# Patient Record
Sex: Female | Born: 1990 | Race: White | Hispanic: No | Marital: Single | Smoking: Never smoker
Health system: Southern US, Community
[De-identification: ages and names within clinical notes are randomized; demographics above are authoritative.]

## PROBLEM LIST (undated history)

## (undated) DIAGNOSIS — N83201 Unspecified ovarian cyst, right side: Secondary | ICD-10-CM

## (undated) HISTORY — PX: INDUCED ABORTION: SHX677

## (undated) HISTORY — PX: WISDOM TOOTH EXTRACTION: SHX21

---

## 2016-10-10 ENCOUNTER — Inpatient Hospital Stay (HOSPITAL_COMMUNITY): Admit: 2016-10-10 | Payer: Self-pay

## 2016-10-10 ENCOUNTER — Other Ambulatory Visit: Payer: Self-pay | Admitting: Obstetrics & Gynecology

## 2016-10-10 ENCOUNTER — Other Ambulatory Visit (HOSPITAL_COMMUNITY)
Admission: RE | Admit: 2016-10-10 | Discharge: 2016-10-10 | Disposition: A | Discharge status: Home / Self Care | Payer: Federal, State, Local not specified - PPO | Source: Ambulatory Visit | Attending: Obstetrics & Gynecology | Admitting: Obstetrics & Gynecology

## 2016-10-10 DIAGNOSIS — Z113 Encounter for screening for infections with a predominantly sexual mode of transmission: Secondary | ICD-10-CM | POA: Insufficient documentation

## 2016-10-10 DIAGNOSIS — Z01419 Encounter for gynecological examination (general) (routine) without abnormal findings: Secondary | ICD-10-CM | POA: Insufficient documentation

## 2016-10-16 LAB — CYTOLOGY - PAP
Chlamydia: NEGATIVE
DIAGNOSIS: NEGATIVE
Neisseria Gonorrhea: NEGATIVE

## 2017-04-07 ENCOUNTER — Emergency Department (HOSPITAL_COMMUNITY): Payer: Federal, State, Local not specified - PPO

## 2017-04-07 ENCOUNTER — Encounter (HOSPITAL_COMMUNITY): Payer: Self-pay | Admitting: Emergency Medicine

## 2017-04-07 ENCOUNTER — Emergency Department (HOSPITAL_COMMUNITY)
Admission: EM | Admit: 2017-04-07 | Discharge: 2017-04-07 | Disposition: A | Discharge status: Home / Self Care | Payer: Federal, State, Local not specified - PPO | Attending: Emergency Medicine | Admitting: Emergency Medicine

## 2017-04-07 DIAGNOSIS — R109 Unspecified abdominal pain: Secondary | ICD-10-CM | POA: Diagnosis present

## 2017-04-07 DIAGNOSIS — R52 Pain, unspecified: Secondary | ICD-10-CM

## 2017-04-07 DIAGNOSIS — D369 Benign neoplasm, unspecified site: Secondary | ICD-10-CM | POA: Diagnosis not present

## 2017-04-07 LAB — URINALYSIS, ROUTINE W REFLEX MICROSCOPIC
BILIRUBIN URINE: NEGATIVE
GLUCOSE, UA: NEGATIVE mg/dL
Hgb urine dipstick: NEGATIVE
KETONES UR: 20 mg/dL — AB
Leukocytes, UA: NEGATIVE
Nitrite: NEGATIVE
PH: 5 (ref 5.0–8.0)
Protein, ur: NEGATIVE mg/dL
Specific Gravity, Urine: 1.023 (ref 1.005–1.030)

## 2017-04-07 LAB — CBC
HEMATOCRIT: 40.6 % (ref 36.0–46.0)
Hemoglobin: 14 g/dL (ref 12.0–15.0)
MCH: 30.4 pg (ref 26.0–34.0)
MCHC: 34.5 g/dL (ref 30.0–36.0)
MCV: 88.3 fL (ref 78.0–100.0)
Platelets: 349 10*3/uL (ref 150–400)
RBC: 4.6 MIL/uL (ref 3.87–5.11)
RDW: 12.5 % (ref 11.5–15.5)
WBC: 12.2 10*3/uL — ABNORMAL HIGH (ref 4.0–10.5)

## 2017-04-07 LAB — LIPASE, BLOOD: Lipase: 17 U/L (ref 11–51)

## 2017-04-07 LAB — COMPREHENSIVE METABOLIC PANEL
ALBUMIN: 4.6 g/dL (ref 3.5–5.0)
ALT: 14 U/L (ref 14–54)
AST: 22 U/L (ref 15–41)
Alkaline Phosphatase: 35 U/L — ABNORMAL LOW (ref 38–126)
Anion gap: 10 (ref 5–15)
BILIRUBIN TOTAL: 1.9 mg/dL — AB (ref 0.3–1.2)
BUN: 13 mg/dL (ref 6–20)
CHLORIDE: 106 mmol/L (ref 101–111)
CO2: 24 mmol/L (ref 22–32)
Calcium: 9.6 mg/dL (ref 8.9–10.3)
Creatinine, Ser: 0.9 mg/dL (ref 0.44–1.00)
GFR calc Af Amer: >60 mL/min (ref >60)
GFR calc non Af Amer: >60 mL/min (ref >60)
GLUCOSE: 122 mg/dL — AB (ref 65–99)
POTASSIUM: 4.6 mmol/L (ref 3.5–5.1)
Sodium: 140 mmol/L (ref 135–145)
TOTAL PROTEIN: 8.2 g/dL — AB (ref 6.5–8.1)

## 2017-04-07 LAB — I-STAT BETA HCG BLOOD, ED (MC, WL, AP ONLY): I-stat hCG, quantitative: <5 m[IU]/mL (ref <5)

## 2017-04-07 MED ORDER — SODIUM CHLORIDE 0.9 % IV BOLUS (SEPSIS)
1000.0000 mL | Freq: Once | INTRAVENOUS | Status: AC
  Administered 2017-04-07: 1000 mL via INTRAVENOUS

## 2017-04-07 MED ORDER — ONDANSETRON HCL 4 MG/2ML IJ SOLN: 4.0000 mg | Freq: Once | INTRAMUSCULAR | Status: DC

## 2017-04-07 MED ORDER — FENTANYL CITRATE (PF) 100 MCG/2ML IJ SOLN
100.0000 ug | Freq: Once | INTRAMUSCULAR | Status: AC
  Administered 2017-04-07: 100 ug via INTRAVENOUS
  Filled 2017-04-07: qty 2

## 2017-04-07 MED ORDER — IOPAMIDOL (ISOVUE-300) INJECTION 61%
INTRAVENOUS | Status: AC
  Filled 2017-04-07: qty 100

## 2017-04-07 MED ORDER — OXYCODONE-ACETAMINOPHEN 5-325 MG PO TABS
2.0000 | ORAL_TABLET | Freq: Once | ORAL | Status: AC
  Administered 2017-04-07: 2 via ORAL
  Filled 2017-04-07: qty 2

## 2017-04-07 MED ORDER — ONDANSETRON 4 MG PO TBDP: 4.0000 mg | ORAL_TABLET | Freq: Three times a day (TID) | ORAL | 0 refills | Status: DC | PRN

## 2017-04-07 MED ORDER — ONDANSETRON 4 MG PO TBDP
4.0000 mg | ORAL_TABLET | Freq: Once | ORAL | Status: AC | PRN
  Administered 2017-04-07: 4 mg via ORAL
  Filled 2017-04-07: qty 1

## 2017-04-07 MED ORDER — IOPAMIDOL (ISOVUE-300) INJECTION 61%
30.0000 mL | Freq: Once | INTRAVENOUS | Status: AC | PRN
  Administered 2017-04-07: 30 mL via ORAL

## 2017-04-07 MED ORDER — OXYCODONE-ACETAMINOPHEN 5-325 MG PO TABS: 2.0000 | ORAL_TABLET | ORAL | 0 refills | Status: DC | PRN

## 2017-04-07 MED ORDER — PROMETHAZINE HCL 25 MG/ML IJ SOLN
12.5000 mg | Freq: Once | INTRAMUSCULAR | Status: AC
  Administered 2017-04-07: 12.5 mg via INTRAVENOUS
  Filled 2017-04-07: qty 1

## 2017-04-07 MED ORDER — IOPAMIDOL (ISOVUE-300) INJECTION 61%
INTRAVENOUS | Status: AC
  Filled 2017-04-07: qty 30

## 2017-04-07 MED ORDER — ONDANSETRON HCL 4 MG/2ML IJ SOLN
4.0000 mg | Freq: Once | INTRAMUSCULAR | Status: AC
  Administered 2017-04-07: 4 mg via INTRAVENOUS
  Filled 2017-04-07: qty 2

## 2017-04-07 MED ORDER — ONDANSETRON 4 MG PO TBDP
4.0000 mg | ORAL_TABLET | Freq: Once | ORAL | Status: AC
  Administered 2017-04-07: 4 mg via ORAL
  Filled 2017-04-07: qty 1

## 2017-04-07 MED ORDER — NAPROXEN 500 MG PO TABS: 500.0000 mg | ORAL_TABLET | Freq: Two times a day (BID) | ORAL | 0 refills | Status: DC

## 2017-04-07 MED ORDER — KETOROLAC TROMETHAMINE 30 MG/ML IJ SOLN
30.0000 mg | Freq: Once | INTRAMUSCULAR | Status: AC
  Administered 2017-04-07: 30 mg via INTRAVENOUS
  Filled 2017-04-07: qty 1

## 2017-04-07 MED ORDER — IOPAMIDOL (ISOVUE-300) INJECTION 61%
100.0000 mL | Freq: Once | INTRAVENOUS | Status: AC | PRN
  Administered 2017-04-07: 80 mL via INTRAVENOUS

## 2017-04-07 NOTE — ED Notes (Signed)
Pt threw up contrast.  MD and CT aware.  Will start IV.

## 2017-04-07 NOTE — ED Provider Notes (Signed)
Pt updated on results of [place]. No torsion. Complex, possible Dermoid. She is still having intermittent episodes of pain. Will give Toradol, Percocet, zofran. She had GYN at  [facility]. Asked to contact for FU asap to discuss ongoing care and treatment.   Rx: Percocet, zofran, and Naproxen.   Tanna Furry, MD 04/07/17 [phone]

## 2017-04-07 NOTE — ED Notes (Signed)
Attempted IV placement with no success-informed MD-OK to give PO fluids

## 2017-04-07 NOTE — ED Triage Notes (Signed)
Patient c/o mid to lower abd pain that started yesterday with vomiting and little diarrhea.  Patient unsure if food poisoning or not but googled that if having severe adb pain or yellow vomit to go to ED.

## 2017-04-07 NOTE — ED Notes (Signed)
Patient stated that shes able to take small sip and keep them down. Patient still doesn't have to use restroom and will call out when ready.

## 2017-04-07 NOTE — ED Provider Notes (Signed)
[facility] DEPT Provider Note   CSN: [id number] Arrival date & time: 04/07/17  0702     History   Chief Complaint Chief Complaint  Patient presents with  . Abdominal Pain  . Emesis    HPI Rhonda Bradshaw is a 26 y.o. female.  She began feeling nauseated and started vomiting yesterday.  She has been unable to tolerate liquids, and has decreased urinary output secondary to that.  She last vomited this morning.  The emesis has been yellow in color.  There is been no blood in the emesis.  She has had a few episodes of loose stools.  She has no known sick contacts.  She is concerned that she may have gotten food poisoning because she ate some eggs salad that she made, which was a few days old.  She has had some chills.  She works as a Patent attorney.  There are no other known modifying factors.  HPI  History reviewed. No pertinent past medical history.  There are no active problems to display for this patient.   History reviewed. No pertinent surgical history.  OB History    No data available       Home Medications    Prior to Admission medications   Not on File    Family History No family history on file.  Social History Social History  Substance Use Topics  . Smoking status: Never Smoker  . Smokeless tobacco: Never Used  . Alcohol use Yes     Comment: occasioanl      Allergies   Patient has no known allergies.   Review of Systems Review of Systems  All other systems reviewed and are negative.    Physical Exam Updated Vital Signs BP 134/87   Pulse 68   Temp 98 F (36.7 C)   Resp 15   Ht 5\' 4"  (1.626 m)   Wt 54.5 kg (120 lb 1 oz)   LMP 03/24/2017   SpO2 95%   BMI 20.61 kg/m   Physical Exam  Constitutional: She is oriented to person, place, and time. She appears well-developed and well-nourished.  HENT:  Head: Normocephalic and atraumatic.  Eyes: Conjunctivae and EOM are normal. Pupils are equal, round, and reactive to light.    Neck: Normal range of motion and phonation normal. Neck supple.  Cardiovascular: Normal rate and regular rhythm.   Pulmonary/Chest: Effort normal and breath sounds normal. She exhibits no tenderness.  Abdominal: Soft. She exhibits no distension and no mass. There is tenderness (Diffuse, mild). There is no rebound and no guarding. No hernia.  Musculoskeletal: Normal range of motion.  Neurological: She is alert and oriented to person, place, and time. She exhibits normal muscle tone.  Skin: Skin is warm and dry. There is erythema (Anterior torso, consistent with sunburn).  Psychiatric: She has a normal mood and affect. Her behavior is normal. Judgment and thought content normal.  Nursing note and vitals reviewed.    ED Treatments / Results  Labs (all labs ordered are listed, but only abnormal results are displayed) Labs Reviewed  COMPREHENSIVE METABOLIC PANEL - Abnormal; Notable for the following:       Result Value   Glucose, Bld 122 (*)    Total Protein 8.2 (*)    Alkaline Phosphatase 35 (*)    Total Bilirubin 1.9 (*)    All other components within normal limits  CBC - Abnormal; Notable for the following:    WBC 12.2 (*)    All other components within  normal limits  LIPASE, BLOOD  URINALYSIS, ROUTINE W REFLEX MICROSCOPIC  I-STAT BETA HCG BLOOD, ED ([facility], [facility], [facility] ONLY)    EKG  EKG Interpretation None       Radiology No results found.  Procedures Procedures (including critical care time)  Medications Ordered in ED Medications  sodium chloride 0.9 % bolus 1,000 mL (not administered)  ondansetron (ZOFRAN) injection 4 mg (not administered)  ondansetron (ZOFRAN-ODT) disintegrating tablet 4 mg (4 mg Oral Given 04/07/17 4818)     Initial Impression / Assessment and Plan / ED Course  I have reviewed the triage vital signs and the nursing notes.  Pertinent labs & imaging results that were available during my care of the patient were reviewed by me and considered in my  medical decision making (see chart for details).  Clinical Course as of Apr 07 1737  Mon Apr 07, 2017  1515 The patient vomited the oral contrast that she had been drinking.  Additional antiemetic medication ordered  [EW]    Clinical Course User Index [EW] Daleen Bo, MD     Patient Vitals for the past 24 hrs:  BP Temp Pulse Resp SpO2 Height Weight  04/07/17 0719 134/87 98 F (36.7 C) 68 15 95 % - -  04/07/17 0717 - - - - - 5\' 4"  (1.626 m) 54.5 kg (120 lb 1 oz)    5:18 PM Reevaluation with update and discussion. After initial assessment and treatment, an updated evaluation reveals patient is fairly comfortable now but still having periods of intermittent right lower quadrant abdominal pain.  Patient updated on findings. Dondi Burandt L   17: 30-case discussed with gynecologist, Dr. Roselie Awkward.  He recommends proceeding with ultrasound, with Doppler, to rule out torsion.  Patient updated and agrees to proceed in this fashion.  Final Clinical Impressions(s) / ED Diagnoses   Final diagnoses:  Dermoid tumor     Vomiting and abdominal pain.  No apparent appendicitis or intestinal disorder.  Dermoid tumor right ovary is present, note patient is having normal periods, last was 2 weeks ago.  Additional evaluation with ultrasound imaging to assess for torsion, pending at the time of transfer of care.  Nursing Notes Reviewed/ Care Coordinated Applicable Imaging Reviewed Interpretation of Laboratory Data incorporated into ED treatment   17: 40-transferred cared, to Dr. Jeneen Rinks, to evaluate patient after ultrasound imaging.  New Prescriptions New Prescriptions   No medications on file     Daleen Bo, MD 04/07/17 1740

## 2017-04-07 NOTE — Discharge Instructions (Signed)
Naproxen and or Percocet for pain. Call your GYN tomorrow to make appointment as soon as possible to discuss definitive care, and possible surgical procedure.

## 2017-04-09 ENCOUNTER — Inpatient Hospital Stay (HOSPITAL_COMMUNITY)
Admission: AD | Admit: 2017-04-09 | Discharge: 2017-04-09 | Disposition: A | Discharge status: Home / Self Care | Payer: Federal, State, Local not specified - PPO | Source: Ambulatory Visit | Attending: Obstetrics and Gynecology | Admitting: Obstetrics and Gynecology

## 2017-04-09 ENCOUNTER — Inpatient Hospital Stay (HOSPITAL_COMMUNITY): Payer: Federal, State, Local not specified - PPO

## 2017-04-09 ENCOUNTER — Encounter (HOSPITAL_COMMUNITY): Payer: Self-pay | Admitting: *Deleted

## 2017-04-09 DIAGNOSIS — R112 Nausea with vomiting, unspecified: Secondary | ICD-10-CM | POA: Insufficient documentation

## 2017-04-09 DIAGNOSIS — R102 Pelvic and perineal pain: Secondary | ICD-10-CM | POA: Insufficient documentation

## 2017-04-09 DIAGNOSIS — N83201 Unspecified ovarian cyst, right side: Secondary | ICD-10-CM | POA: Diagnosis present

## 2017-04-09 DIAGNOSIS — R109 Unspecified abdominal pain: Secondary | ICD-10-CM | POA: Diagnosis present

## 2017-04-09 DIAGNOSIS — N83209 Unspecified ovarian cyst, unspecified side: Secondary | ICD-10-CM

## 2017-04-09 HISTORY — DX: Unspecified ovarian cyst, right side: N83.201

## 2017-04-09 MED ORDER — LACTATED RINGERS IV SOLN
INTRAVENOUS | Status: DC
  Administered 2017-04-09: 04:00:00 via INTRAVENOUS

## 2017-04-09 MED ORDER — HYDROMORPHONE HCL 1 MG/ML IJ SOLN
1.0000 mg | Freq: Once | INTRAMUSCULAR | Status: AC
  Administered 2017-04-09: 1 mg via INTRAVENOUS

## 2017-04-09 MED ORDER — HYDROMORPHONE HCL 1 MG/ML IJ SOLN
1.0000 mg | Freq: Once | INTRAMUSCULAR | Status: DC
  Filled 2017-04-09: qty 1

## 2017-04-09 MED ORDER — PROMETHAZINE HCL 25 MG/ML IJ SOLN
12.5000 mg | Freq: Once | INTRAMUSCULAR | Status: AC
  Administered 2017-04-09: 12.5 mg via INTRAVENOUS
  Filled 2017-04-09: qty 1

## 2017-04-09 MED ORDER — HYDROMORPHONE HCL 2 MG PO TABS: 2.0000 mg | ORAL_TABLET | Freq: Four times a day (QID) | ORAL | 0 refills | Status: DC | PRN

## 2017-04-09 NOTE — MAU Note (Signed)
History     26 y/o G1P0010 LMP 03/24/17 with known history of right ovarian cyst presenting with right sided lower abdominal pain that was un resolving with percocet and naproxen use.  Pt. also had nausea symptoms but no recent vomiting.    Patient Active Problem List   Diagnosis Date Noted  . Ovarian cyst, right 04/09/2017    Chief Complaint  Patient presents with  . Abdominal Pain   HPI  OB History    Gravida Para Term Preterm AB Living   1       1     SAB TAB Ectopic Multiple Live Births     1            Past Medical History:  Diagnosis Date  . Ovarian cyst, right 04/09/2017    History reviewed. No pertinent surgical history.  History reviewed. No pertinent family history.  Social History  Substance Use Topics  . Smoking status: Never Smoker  . Smokeless tobacco: Never Used  . Alcohol use Yes     Comment: occasioanl     Allergies: No Known Allergies  Prescriptions Prior to Admission  Medication Sig Dispense Refill Last Dose  . ibuprofen (ADVIL,MOTRIN) 200 MG tablet Take 400 mg by mouth every 6 (six) hours as needed for mild pain or moderate pain.   04/07/2017 at Unknown time  . JUNEL FE 1/20 1-20 MG-MCG tablet Take 1 tablet by mouth daily.  3 04/07/2017 at Unknown time  . naproxen (NAPROSYN) 500 MG tablet Take 1 tablet (500 mg total) by mouth 2 (two) times daily. 30 tablet 0   . ondansetron (ZOFRAN ODT) 4 MG disintegrating tablet Take 1 tablet (4 mg total) by mouth every 8 (eight) hours as needed for nausea. 6 tablet 0   . oxyCODONE-acetaminophen (PERCOCET/ROXICET) 5-325 MG tablet Take 2 tablets by mouth every 4 (four) hours as needed. 12 tablet 0     ROS  Constitutional: Denies fevers/chills Cardiovascular: Denies chest pain or palpitations Pulmonary: Denies coughing or wheezing Gastrointestinal: With nausea, denies vomiting or diarrhea.  With constipation. Genitourinary: With pelvic pain, unusual vaginal bleeding, unusual vaginal discharge, dysuria, urgency or  frequency.  Musculoskeletal: Denies muscle or joint aches and pain.  Neurology: Denies abnormal sensations such as tingling or numbness.   Physical Exam   Blood pressure 107/69, pulse (!) 58, temperature 98.3 F (36.8 C), temperature source Oral, resp. rate 18, height 5\' 4"  (1.626 m), weight 55.2 kg (121 lb 12 oz), last menstrual period 03/24/2017. Gen: Sleepy, no distress.  Mother and uncle at bedside.  CVS: s1, s2, RRR Pulm: CTAB Abdomen: Soft, positive bowel sounds, tender to palpation in lower abdomen bilaterally, right greater than left side.  No rebound, no guarding.  Extremities: Warm and well perfused, no edema, no calf tenderness bilaterally.   IMAGING:   Pelvic Ultrasound 04/09/2017:    FINDINGS: Uterus  Measurements: 7.4 x 4.3 x 5.7 cm. No fibroids or other mass visualized.  Endometrium  Thickness: 0.3 cm. No focal abnormality visualized.  Right ovary  Measurements: 10.0 x 8.6 x 7.3 cm. A large simple appearing 8.9 x 8.5 x 7.3 cm cyst is noted at the right ovary.  Left ovary  Not visualized.  Pulsed Doppler evaluation demonstrates normal low-resistance arterial and venous waveforms at the right ovary.  IMPRESSION: No evidence for ovarian torsion on the right. Large 9 cm right ovarian cyst again noted. The left ovary is not visualized.   CT ABDOMEN AND PELVIS WITH CONTRAST: 04/07/2017  TECHNIQUE: Multidetector CT imaging of the abdomen and pelvis was performed using the standard protocol following bolus administration of intravenous contrast.  CONTRAST:  75mL ISOVUE-300 IOPAMIDOL (ISOVUE-300) INJECTION 61%  COMPARISON:  None.  FINDINGS: Lower chest: No acute abnormality.  Hepatobiliary: No focal liver abnormality is seen. No gallstones, gallbladder wall thickening, or biliary dilatation.  Pancreas: Unremarkable. No pancreatic ductal dilatation or surrounding inflammatory changes.  Spleen: Normal in size without focal  abnormality.  Adrenals/Urinary Tract: Adrenal glands are unremarkable. Kidneys are normal, without renal calculi, focal lesion, or hydronephrosis. Bladder is unremarkable.  Stomach/Bowel: Stomach is within normal limits. Appendix appears normal. No evidence of bowel wall thickening, distention, or inflammatory changes.  Vascular/Lymphatic: No significant vascular findings are present. No enlarged abdominal or pelvic lymph nodes.  Reproductive: Right ovarian 11.4 cm dermoid tumor with a large 9.6 cm cystic component and a contiguous 5.1 cm more solid component with macroscopic fat and coarse calcifications. No adjacent inflammatory/edematous changes. Uterus and left adnexal region unremarkable.  Other: Trace free fluid in the pelvis, probably physiologic. No free air.  Musculoskeletal: Transitional lumbosacral segment. No fracture, dislocation, or worrisome bone lesion.  IMPRESSION: 1. No acute findings. 2. 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 Recommend gynecologic consultation.  Pelvic Ultrasound: 04/07/2017  FINDINGS: Uterus  Measurements: 7.8 x 3.5 x 5 cm. No fibroids or other mass visualized.  Endometrium  Thickness: 2.5 mm.  No focal abnormality visualized.  Right ovary  In the right adnexa, there is a large right ovarian cyst with internal septations measuring 11.6 x 4.9 x 9 cm.  Left ovary  Measurements: 2.7 x 1.4 x 3 cm. Normal appearance/no adnexal mass.  Pulsed Doppler evaluation of both ovaries demonstrates normal low-resistance arterial and venous waveforms.  Other findings  Small amount of free fluid is identified in the pelvis.  IMPRESSION: Large complicated cyst of the right ovary measuring 11.6 x 4.9 x 9 cm. There is no evidence a right ovary torsion as arterial and venous flow are identified in the periphery of the complicated cyst/right ovarian tissue.    Physical Exam: see above.   ED Course   Assessment:  Patient received dilaudid 1 mg IV x 1 and phenergan 25 mg IV x 1.  She felt better after pain medication use, rating her pain from, level 8/10 intensity to level 3 - 4/10.  She had no vomiting episodes in MAU.  Ultrasound results were discussed with the patient and her family that showed 9cm right ovarian, simple cyst and normal doppler flow studies to bilateral ovaries.  We discussed that prior ultrasound had shown complex ovarian cyst but that  it looks more like a simple cyst today.  We also discussed cyst looked smaller than before (11.6cm vs. 8.9cm today) and that simple cysts can resolve on own with time.  She was given options of expectant management versus surgical management via laparoscopy ovarian cystectomy, possible oophorectomy, possible laparotomy after discussing risks, benefits and alternatives of both approaches.       Plan:  - Patient desired expectant management for now.  Strict pain, bleeding, ovarian torsion and cyst rupture precautions were reviewed.   -She has an upcoming appointment with Dr. Nelda Marseille tomorrow at 7.45 am and she will follow up with her.   - All her questions were answered and she expressed understanding.  - She was discharged home with dilaudid oral tablets, also continue with naproxen tabs as prescribed before.  - She was advised to use miralax for constipation as she has  been without bowel movement for about 2 days.    Dr. Alesia Richards.  04/09/2017. 9311     Alinda Dooms MD.  04/09/2017 6:58 AM

## 2017-04-09 NOTE — Discharge Instructions (Signed)
Ovarian Cyst An ovarian cyst is a fluid-filled sac that forms on an ovary. The ovaries are small organs that produce eggs in women. Various types of cysts can form on the ovaries. Some may cause symptoms and require treatment. Most ovarian cysts go away on their own, are not cancerous (are benign), and do not cause problems. Common types of ovarian cysts include:  Functional (follicle) cysts. ? Occur during the menstrual cycle, and usually go away with the next menstrual cycle if you do not get pregnant. ? Usually cause no symptoms.  Endometriomas. ? Are cysts that form from the tissue that lines the uterus (endometrium). ? Are sometimes called chocolate cysts because they become filled with blood that turns brown. ? Can cause pain in the lower abdomen during intercourse and during your period.  Cystadenoma cysts. ? Develop from cells on the outside surface of the ovary. ? Can get very large and cause lower abdomen pain and pain with intercourse. ? Can cause severe pain if they twist or break open (rupture).  Dermoid cysts. ? Are sometimes found in both ovaries. ? May contain different kinds of body tissue, such as skin, teeth, hair, or cartilage. ? Usually do not cause symptoms unless they get very big.  Theca lutein cysts. ? Occur when too much of a certain hormone (human chorionic gonadotropin) is produced and overstimulates the ovaries to produce an egg. ? Are most common after having procedures used to assist with the conception of a baby (in vitro fertilization).  What are the causes? Ovarian cysts may be caused by:  Ovarian hyperstimulation syndrome. This is a condition that can develop from taking fertility medicines. It causes multiple large ovarian cysts to form.  Polycystic ovarian syndrome (PCOS). This is a common hormonal disorder that can cause ovarian cysts, as well as problems with your period or fertility.  What increases the risk? The following factors may make  you more likely to develop ovarian cysts:  Being overweight or obese.  Taking fertility medicines.  Taking certain forms of hormonal birth control.  Smoking.  What are the signs or symptoms? Many ovarian cysts do not cause symptoms. If symptoms are present, they may include:  Pelvic pain or pressure.  Pain in the lower abdomen.  Pain during sex.  Abdominal swelling.  Abnormal menstrual periods.  Increasing pain with menstrual periods.  How is this diagnosed? These cysts are commonly found during a routine pelvic exam. You may have tests to find out more about the cyst, such as:  Ultrasound.  X-ray of the pelvis.  CT scan.  MRI.  Blood tests.  How is this treated? Many ovarian cysts go away on their own without treatment. Your health care provider may want to check your cyst regularly for 2-3 months to see if it changes. If you are in menopause, it is especially important to have your cyst monitored closely because menopausal women have a higher rate of ovarian cancer. When treatment is needed, it may include:  Medicines to help relieve pain.  A procedure to drain the cyst (aspiration).  Surgery to remove the whole cyst.  Hormone treatment or birth control pills. These methods are sometimes used to help dissolve a cyst.  Follow these instructions at home:  Take over-the-counter and prescription medicines only as told by your health care provider.  Do not drive or use heavy machinery while taking prescription pain medicine.  Get regular pelvic exams and Pap tests as often as told by your health care  provider.  Return to your normal activities as told by your health care provider. Ask your health care provider what activities are safe for you.  Do not use any products that contain nicotine or tobacco, such as cigarettes and e-cigarettes. If you need help quitting, ask your health care provider.  Keep all follow-up visits as told by your health care provider.  This is important. Contact a health care provider if:  Your periods are late, irregular, or painful, or they stop.  You have pelvic pain that does not go away.  You have pressure on your bladder or trouble emptying your bladder completely.  You have pain during sex.  You have any of the following in your abdomen: ? A feeling of fullness. ? Pressure. ? Discomfort. ? Pain that does not go away. ? Swelling.  You feel generally ill.  You become constipated.  You lose your appetite.  You develop severe acne.  You start to have more body hair and facial hair.  You are gaining weight or losing weight without changing your exercise and eating habits.  You think you may be pregnant. Get help right away if:  You have abdominal pain that is severe or gets worse.  You cannot eat or drink without vomiting.  You suddenly develop a fever.  Your menstrual period is much heavier than usual. This information is not intended to replace advice given to you by your health care provider. Make sure you discuss any questions you have with your health care provider. Document Released: 09/23/2005 Document Revised: 04/12/2016 Document Reviewed: 02/25/2016 Elsevier Interactive Patient Education  2018 [place] [employer].  Pelvic Mass A pelvic mass is an abnormal growth in the pelvis. The pelvis is the area between your hip bones. It includes the bladder and the rectum in males and females, and also the uterus and ovaries in females. What are the causes? Many things can cause a pelvic mass, including:  Cancer.  Fibroids of the uterus.  Ovarian cysts.  Infection.  Ectopic pregnancy.  What are the signs or symptoms? Symptoms of a pelvic mass may include:  Cramping.  Nausea.  Diarrhea.  Fever.  Vomiting.  Weakness.  Pain in the pelvis, side, or back.  Weight loss.  Constipation.  Problems with vaginal bleeding, including: ? Light or heavy bleeding with or without blood  clots. ? Irregular menstruation. ? Pain with menstruation.  Problems with urination, including: ? Frequent urination. ? Inability to empty the bladder completely. ? Urinating very small amounts. ? Pain with urination. ? Bloody urine.  Some pelvic masses do not cause symptoms. How is this diagnosed? To make a diagnosis, your health care provider will need to learn more about the mass. You may have tests or procedures done, such as:  Blood tests.  X-rays.  Ultrasound.  CT scan.  MRI.  A surgery to look inside of your abdomen with cameras (laparoscopy).  A biopsy that is performed with a needle or during laparoscopy or surgery.  In some cases, what seemed like a pelvic mass may actually be something else, such as a mass in one of the organs that are near the pelvis, an infection (abscess) or scar tissue (adhesions) that formed after a surgery. How is this treated? Treatment will depend on the cause of the mass. Follow these instructions at home: What you need to do at home will depend on the cause of the mass. Follow the instructions that your health care provider gives to you. In general:  Keep  all follow-up visits as directed by your health care provider. This is important.  Take medicines only as directed by your health care provider.  Follow any restrictions that are given to you by your health care provider.  Contact a health care provider if:  You develop new symptoms. Get help right away if:  You vomit bright red blood or vomit material that looks like coffee grounds.  You have blood in your stools, or the stools turn black and tarry.  You have an abnormal or increased amount of vaginal bleeding.  You have a fever.  You develop easy bruising or bleeding.  You develop sudden or worsening pain that is not controlled by your medicine.  You feel worsening weakness, or you have a fainting episode.  You feel that the mass has suddenly gotten larger.  You  develop severe bloating in your abdomen or your pelvis.  You cannot pass any urine.  You are unable to have a bowel movement. This information is not intended to replace advice given to you by your health care provider. Make sure you discuss any questions you have with your health care provider. Document Released: 12/31/2006 Document Revised: 02/29/2016 Document Reviewed: 05/09/2014 Elsevier Interactive Patient Education  2018 [employer].

## 2017-04-09 NOTE — MAU Provider Note (Signed)
History     CSN: [id number]  Arrival date and time: 04/09/17 0121   First Provider Initiated Contact with Patient 04/09/17 [phone]      Chief Complaint  Patient presents with  . Abdominal Pain   HPI  Ms. Rhonda Bradshaw is a 26 yo G1P0010 non-pregnant female presenting with complaints of pain from ovarian cyst and N/V from the pain. She took 1 Percocet at 2230 and then another one at 16.  She's been taking Naproxen as well as Naproxen with no relief. She states that Dr. Landry Mellow reviewed her images and told her that "the cyst would moren than likely need to be surgically removed, but Dr. Nelda Marseille would need to make that decision when she returns on Thursday 7/5".  She states that she "cannot take it another 24 hrs".  She is requesting that her surgery be done today.  She states she has been taking pain med and anti-nausea med off and on without any relief.  She rates her pain a 8-9/10.  History reviewed. No pertinent past medical history.  History reviewed. No pertinent surgical history.  History reviewed. No pertinent family history.  Social History  Substance Use Topics  . Smoking status: Never Smoker  . Smokeless tobacco: Never Used  . Alcohol use Yes     Comment: occasioanl     Allergies: No Known Allergies  Prescriptions Prior to Admission  Medication Sig Dispense Refill Last Dose  . ibuprofen (ADVIL,MOTRIN) 200 MG tablet Take 400 mg by mouth every 6 (six) hours as needed for mild pain or moderate pain.   04/07/2017 at Unknown time  . JUNEL FE 1/20 1-20 MG-MCG tablet Take 1 tablet by mouth daily.  3 04/07/2017 at Unknown time  . naproxen (NAPROSYN) 500 MG tablet Take 1 tablet (500 mg total) by mouth 2 (two) times daily. 30 tablet 0   . ondansetron (ZOFRAN ODT) 4 MG disintegrating tablet Take 1 tablet (4 mg total) by mouth every 8 (eight) hours as needed for nausea. 6 tablet 0   . oxyCODONE-acetaminophen (PERCOCET/ROXICET) 5-325 MG tablet Take 2 tablets by mouth every 4 (four) hours as  needed. 12 tablet 0     Review of Systems  Constitutional: Negative.   HENT: Negative.   Eyes: Negative.   Respiratory: Negative.   Gastrointestinal: Positive for abdominal pain (R>L), nausea and vomiting.  Endocrine: Negative.   Genitourinary: Positive for pelvic pain.  Musculoskeletal: Negative.   Skin: Negative.   Allergic/Immunologic: Negative.   Neurological: Negative.   Hematological: Negative.   Psychiatric/Behavioral: Negative.    Physical Exam   Blood pressure 117/71, pulse 69, temperature 97.8 F (36.6 C), temperature source Oral, resp. rate 20, height 5\' 4"  (1.626 m), weight 55.2 kg (121 lb 12 oz), last menstrual period 03/24/2017.  Physical Exam  Constitutional: She is oriented to person, place, and time. She appears well-developed and well-nourished.  HENT:  Head: Normocephalic.  Eyes: Pupils are equal, round, and reactive to light.  Neck: Normal range of motion.  Cardiovascular: Normal rate, regular rhythm and normal heart sounds.   Respiratory: Effort normal and breath sounds normal.  GI: Soft. Bowel sounds are normal. There is tenderness. There is guarding.  Genitourinary:  Genitourinary Comments: Pelvic deferred  Musculoskeletal: Normal range of motion.  Neurological: She is alert and oriented to person, place, and time. She has normal reflexes.  Skin: Skin is warm and dry.  Psychiatric: She has a normal mood and affect. Her behavior is normal. Judgment and thought content normal.  MAU Course  Procedures  MDM LR infusion 1000 ml with Phenergan 12.5 mg @ 500 ml/hr Dilaudid 1 mg IVP Pelvis complete  US Pelvis Complete  Result Date: 04/09/2017 CLINICAL DATA:  Acute onset of pelvic pain. Known large right ovarian cyst. Assess for ovarian torsion. Initial encounter. EXAM: TRANSABDOMINAL ULTRASOUND OF PELVIS DOPPLER ULTRASOUND OF OVARIES TECHNIQUE: Transabdominal ultrasound examination of the pelvis was performed including evaluation of the uterus,  ovaries, adnexal regions, and pelvic cul-de-sac. Color and duplex Doppler ultrasound was utilized to evaluate blood flow to the ovaries. COMPARISON:  CT of the abdomen and pelvis, and pelvic ultrasound performed 04/07/2017 FINDINGS: Uterus Measurements: 7.4 x 4.3 x 5.7 cm. No fibroids or other mass visualized. Endometrium Thickness: 0.3 cm. No focal abnormality visualized. Right ovary Measurements: 10.0 x 8.6 x 7.3 cm. A large simple appearing 8.9 x 8.5 x 7.3 cm cyst is noted at the right ovary. Left ovary Not visualized. Pulsed Doppler evaluation demonstrates normal low-resistance arterial and venous waveforms at the right ovary. IMPRESSION: No evidence for ovarian torsion on the right. Large 9 cm right ovarian cyst again noted. The left ovary is not visualized. Electronically Signed   By: Garald Balding M.D.   On: 04/09/2017 06:07   [place] Art/ven Flow Abd Pelv Doppler  Result Date: 04/09/2017 CLINICAL DATA:  Acute onset of pelvic pain. Known large right ovarian cyst. Assess for ovarian torsion. Initial encounter. EXAM: TRANSABDOMINAL ULTRASOUND OF PELVIS DOPPLER ULTRASOUND OF OVARIES TECHNIQUE: Transabdominal ultrasound examination of the pelvis was performed including evaluation of the uterus, ovaries, adnexal regions, and pelvic cul-de-sac. Color and duplex Doppler ultrasound was utilized to evaluate blood flow to the ovaries. COMPARISON:  CT of the abdomen and pelvis, and pelvic ultrasound performed 04/07/2017 FINDINGS: Uterus Measurements: 7.4 x 4.3 x 5.7 cm. No fibroids or other mass visualized. Endometrium Thickness: 0.3 cm. No focal abnormality visualized. Right ovary Measurements: 10.0 x 8.6 x 7.3 cm. A large simple appearing 8.9 x 8.5 x 7.3 cm cyst is noted at the right ovary. Left ovary Not visualized. Pulsed Doppler evaluation demonstrates normal low-resistance arterial and venous waveforms at the right ovary. IMPRESSION: No evidence for ovarian torsion on the right. Large 9 cm right ovarian cyst again  noted. The left ovary is not visualized. Electronically Signed   By: Garald Balding M.D.   On: 04/09/2017 06:07   *Consult with Dr. Alesia Richards at [place] [place] - orders for pelvic u/s with doppler flow studies given  Care assumed by Dr. Alesia Richards at 0630 - see her note for A/P  Laury Deep, CNM 04/09/2017 3:01 AM Assessment and Plan

## 2017-04-09 NOTE — MAU Note (Addendum)
PT SAYS  SHE WENT  TO [facility] ON   7-2  - GAVE  PAIN MEDS  AND  NAUSEA    AND  TOLD TO FOLLOW UP WITH HER DR.      SHE  CALLED OFFICE  AND SAID  DR Landry Mellow  LOOKED  AT  RESULTS.       SHE TOOK  1 OXYCODONE AT   1030PM AND   0030 AM  -      SHE TOOK NAUSEA  MED AT 730PM

## 2017-04-10 NOTE — H&P (Signed)
26yo G0 who presents for surgical management of right ovarian cyst.  In review, she reports that the pain started about a week ago and has been present ever since- sometimes it's better than other times. Pain is most noticeable following meals. Pain is on her right side, no radiation. Mild nausea, but able to eat without problems. No vomiting. Due to this pain, imaging has been completed that showed the following: [place] on 7/2: Large complicated cyst of the right ovary measuring 11.6 x 4.9 x 9 cm. There is no evidence a right ovary torsion as arterial and venous flow are identified in the periphery of the complicated cyst/right ovarian tissue. Left ovary appears normal. 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  Again, due to the persistence of pain, she returned to the ER on 7/3: Repeat US at that time showed: 7cm normal uterus. Normal left ovary. Right ovary measured- 10.0 x 8.6 x 7.3 cm. A large simple appearing 8.9 x 8.5 x 7.3 cm cyst is noted at the right ovary- flow noted. No evidence of torsion. She has been given Naproxen and hydrocodone- medicine does help, but often pain returns. Not sexually active recently. Denies abnormal bleeding. On OCPs for contraception, menses regular each month. Denies abnormal discharge, itching or irritatin. Pt presents with her mother's boyfriend, Olivia Mackie.   Current Medications  Taking   Junel FE 1/20(Norethin Ace-Eth Estrad-FE) 1-20 MG-MCG Tablet 1 tablet Orally Once a day     Hydromorphone HCl 2 MG Tablet 1 tablet as needed Orally every 6 hrs   Naproxen 500 MG Tablet 1 tablet with food or milk Orally Twice a day   Zofran ODT(Ondansetron HCl) 4 MG Tablet Disintegrating 1 tablet on the tongue and allow to dissolve as needed Orally every 4 hrs   Medication List reviewed and reconciled  with the patient    Past Medical History  No Medical History..           Surgical History  Denies Past Surgical History   Family History  Father: alive  Mother: alive  denies any GYN family cancer hx.   Social History  General:  Tobacco use  cigarettes: Never smoked Tobacco history last updated 04/10/2017 no Alcohol.  no Recreational drug use.  Marital Status: single.  EDUCATION: [employer]    Gyn History  Sexual activity currently sexually active.  Periods : every month.  LMP Sometime in June 2018.  Birth control condoms.  Last pap smear date 10/10/2016 Negative.  Denies H/O Last mammogram date.  Denies H/O Abnormal pap smear.  Denies H/O STD.    OB History  Never been pregnant per patient.    Allergies  N.K.D.A.   Hospitalization/Major Diagnostic Procedure  Denies Past Hospitalization   Review of Systems  CONSTITUTIONAL:  no Chills. no Fever. no Night sweats.  HEENT:  Blurrred vision no. no Double vision.  CARDIOLOGY:  no Chest pain.  RESPIRATORY:  no Shortness of breath. no Cough.  UROLOGY:  no Urinary frequency. no Urinary incontinence. no Urinary urgency.  GASTROENTEROLOGY:  Abdominal pain yes. Appetite change yes. no Change in bowel movements. Constipation yes, since starting the pain medication.  FEMALE REPRODUCTIVE:  no Breast lumps or discharge. no Breast pain.  NEUROLOGY:  no Dizziness. no Headache. no Loss of consciousness.  PSYCHOLOGY:  Anxiety yes. no  Depression.  SKIN:  no Rash. no Hives.  HEMATOLOGY/LYMPH:  no Anemia. no Fatigue. Using Blood Thinners no.     Vital Signs  Wt 118, Wt change -5 lb, Ht 64, BMI 20.25, Pulse sitting 85, BP sitting 90/60.   Examination  General Examination: GENERAL APPEARANCE well developed, well nourished .  SKIN: warm and dry, no rashes .  NECK: supple, normal appearance .  LUNGS: regular breathing rate and effort .  HEART: no murmurs, regular rate and rhythm.  ABDOMEN:  +RLQ tenderness, soft, no rebound, no guarding.  FEMALE GENITOURINARY: normal external genitalia, labia - unremarkable, vagina - pink moist mucosa, no lesions or abnormal discharge, cervix - no discharge or lesions or CMT, adnexa - right ovarian mobile mass appreciated, no tenderness or guarding on exam.  MUSCULOSKELETAL no calf tenderness bilaterally .  EXTREMITIES: no edema present .  PSYCH: appropriate mood and affect .     A/P: 26yo G0 who presents for diagnostic laparoscopy, removal of right ovarian cyst and possible oophorectomy -NPO -LR @ 125cc/hr -SCDs to OR -Risk/benefit and alternatives reviewed with patient including risk of bleeding, infection and injury to surrounding organs.  Questions and concerns were addressed and pt wishes to proceed.  [name], DO [phone] (pager) [phone] (office)

## 2017-04-11 ENCOUNTER — Encounter (HOSPITAL_COMMUNITY): Payer: Self-pay

## 2017-04-11 ENCOUNTER — Encounter (HOSPITAL_COMMUNITY)
Admission: RE | Admit: 2017-04-11 | Discharge: 2017-04-11 | Disposition: A | Discharge status: Home / Self Care | Payer: Federal, State, Local not specified - PPO | Source: Ambulatory Visit | Attending: Obstetrics & Gynecology | Admitting: Obstetrics & Gynecology

## 2017-04-11 DIAGNOSIS — D3911 Neoplasm of uncertain behavior of right ovary: Secondary | ICD-10-CM | POA: Diagnosis not present

## 2017-04-11 DIAGNOSIS — N83201 Unspecified ovarian cyst, right side: Secondary | ICD-10-CM | POA: Diagnosis present

## 2017-04-11 LAB — CBC
HEMATOCRIT: 39.9 % (ref 36.0–46.0)
HEMOGLOBIN: 13.7 g/dL (ref 12.0–15.0)
MCH: 31 pg (ref 26.0–34.0)
MCHC: 34.3 g/dL (ref 30.0–36.0)
MCV: 90.3 fL (ref 78.0–100.0)
Platelets: 337 10*3/uL (ref 150–400)
RBC: 4.42 MIL/uL (ref 3.87–5.11)
RDW: 12.5 % (ref 11.5–15.5)
WBC: 7.4 10*3/uL (ref 4.0–10.5)

## 2017-04-11 LAB — TYPE AND SCREEN
ABO/RH(D): A POS
Antibody Screen: NEGATIVE

## 2017-04-11 LAB — COMPREHENSIVE METABOLIC PANEL
ALK PHOS: 35 U/L — AB (ref 38–126)
ALT: 24 U/L (ref 14–54)
AST: 21 U/L (ref 15–41)
Albumin: 4.1 g/dL (ref 3.5–5.0)
Anion gap: 6 (ref 5–15)
BILIRUBIN TOTAL: 1.4 mg/dL — AB (ref 0.3–1.2)
BUN: 6 mg/dL (ref 6–20)
CALCIUM: 9.3 mg/dL (ref 8.9–10.3)
CO2: 26 mmol/L (ref 22–32)
CREATININE: 0.75 mg/dL (ref 0.44–1.00)
Chloride: 103 mmol/L (ref 101–111)
GFR calc Af Amer: >60 mL/min (ref >60)
Glucose, Bld: 101 mg/dL — ABNORMAL HIGH (ref 65–99)
POTASSIUM: 4.3 mmol/L (ref 3.5–5.1)
Sodium: 135 mmol/L (ref 135–145)
TOTAL PROTEIN: 7.3 g/dL (ref 6.5–8.1)

## 2017-04-11 LAB — ABO/RH: ABO/RH(D): A POS

## 2017-04-11 NOTE — Patient Instructions (Signed)
Your procedure is scheduled on:  Monday, April 14, 2017  Enter through the [employer] [facility] at:  9:00 AM  Pick up the phone at the desk and dial [phone].  Call this number if you have problems the morning of surgery: [phone].  Remember: Do NOT eat food or drink after:  Midnight Sunday  Take these medicines the morning of surgery with a SIP OF WATER:  None  Stop ALL herbal medications at this time  Do NOT smoke the day of surgery.  Do NOT wear jewelry (body piercing), metal hair clips/bobby pins, make-up, artifical eyelashes or nail polish. Do NOT wear lotions, powders, or perfumes.  You may wear deodorant. Do NOT shave for 48 hours prior to surgery. Do NOT bring valuables to the hospital. Contacts, dentures, or bridgework may not be worn into surgery.  Have a responsible adult drive you home and stay with you for 24 hours after your procedure  Bring a copy of your healthcare power of attorney and living will documents.

## 2017-04-14 ENCOUNTER — Encounter (HOSPITAL_COMMUNITY): Payer: Self-pay | Admitting: *Deleted

## 2017-04-14 ENCOUNTER — Encounter (HOSPITAL_COMMUNITY)
Admission: RE | Disposition: A | Discharge status: Home / Self Care | Payer: Self-pay | Source: Ambulatory Visit | Attending: Obstetrics & Gynecology

## 2017-04-14 ENCOUNTER — Ambulatory Visit (HOSPITAL_COMMUNITY): Payer: Federal, State, Local not specified - PPO | Admitting: Certified Registered Nurse Anesthetist

## 2017-04-14 ENCOUNTER — Ambulatory Visit (HOSPITAL_COMMUNITY)
Admission: RE | Admit: 2017-04-14 | Discharge: 2017-04-14 | Disposition: A | Discharge status: Home / Self Care | Payer: Federal, State, Local not specified - PPO | Source: Ambulatory Visit | Attending: Obstetrics & Gynecology | Admitting: Obstetrics & Gynecology

## 2017-04-14 DIAGNOSIS — D3911 Neoplasm of uncertain behavior of right ovary: Secondary | ICD-10-CM | POA: Diagnosis not present

## 2017-04-14 LAB — PREGNANCY, URINE: PREG TEST UR: NEGATIVE

## 2017-04-14 SURGERY — OOPHORECTOMY, LAPAROSCOPIC
Anesthesia: General | Site: Abdomen | Laterality: Right

## 2017-04-14 MED ORDER — KETOROLAC TROMETHAMINE 30 MG/ML IJ SOLN
INTRAMUSCULAR | Status: AC
  Filled 2017-04-14: qty 1

## 2017-04-14 MED ORDER — FENTANYL CITRATE (PF) 250 MCG/5ML IJ SOLN
INTRAMUSCULAR | Status: AC
  Filled 2017-04-14: qty 5

## 2017-04-14 MED ORDER — PROMETHAZINE HCL 25 MG/ML IJ SOLN: 6.2500 mg | INTRAMUSCULAR | Status: DC | PRN

## 2017-04-14 MED ORDER — ROCURONIUM BROMIDE 100 MG/10ML IV SOLN
INTRAVENOUS | Status: AC
  Filled 2017-04-14: qty 1

## 2017-04-14 MED ORDER — HYDROMORPHONE HCL 2 MG PO TABS: 2.0000 mg | ORAL_TABLET | Freq: Three times a day (TID) | ORAL | 0 refills | Status: AC | PRN

## 2017-04-14 MED ORDER — HYDROMORPHONE HCL 1 MG/ML IJ SOLN
INTRAMUSCULAR | Status: DC | PRN
  Administered 2017-04-14: 1 mg via INTRAVENOUS

## 2017-04-14 MED ORDER — MEPERIDINE HCL 25 MG/ML IJ SOLN: 6.2500 mg | INTRAMUSCULAR | Status: DC | PRN

## 2017-04-14 MED ORDER — BUPIVACAINE HCL (PF) 0.25 % IJ SOLN
INTRAMUSCULAR | Status: DC | PRN
  Administered 2017-04-14: 30 mL

## 2017-04-14 MED ORDER — IBUPROFEN 600 MG PO TABS: 600.0000 mg | ORAL_TABLET | Freq: Four times a day (QID) | ORAL | 0 refills | Status: AC | PRN

## 2017-04-14 MED ORDER — HYDROMORPHONE HCL 1 MG/ML IJ SOLN
INTRAMUSCULAR | Status: AC
  Filled 2017-04-14: qty 0.5

## 2017-04-14 MED ORDER — KETOROLAC TROMETHAMINE 30 MG/ML IJ SOLN
INTRAMUSCULAR | Status: DC | PRN
  Administered 2017-04-14: 30 mg via INTRAVENOUS

## 2017-04-14 MED ORDER — LIDOCAINE HCL (CARDIAC) 20 MG/ML IV SOLN
INTRAVENOUS | Status: DC | PRN
  Administered 2017-04-14: 100 mg via INTRAVENOUS

## 2017-04-14 MED ORDER — MIDAZOLAM HCL 2 MG/2ML IJ SOLN
INTRAMUSCULAR | Status: AC
  Filled 2017-04-14: qty 2

## 2017-04-14 MED ORDER — LACTATED RINGERS IV SOLN: INTRAVENOUS | Status: DC

## 2017-04-14 MED ORDER — MIDAZOLAM HCL 2 MG/2ML IJ SOLN
INTRAMUSCULAR | Status: DC | PRN
  Administered 2017-04-14: 1.5 mg via INTRAVENOUS
  Administered 2017-04-14: 0.5 mg via INTRAVENOUS

## 2017-04-14 MED ORDER — OXYCODONE HCL 5 MG/5ML PO SOLN: 5.0000 mg | Freq: Once | ORAL | Status: DC | PRN

## 2017-04-14 MED ORDER — HYDROMORPHONE HCL 1 MG/ML IJ SOLN
0.2500 mg | INTRAMUSCULAR | Status: DC | PRN
Start: 2017-04-14 — End: 2017-04-14
  Administered 2017-04-14: 0.5 mg via INTRAVENOUS

## 2017-04-14 MED ORDER — ROCURONIUM BROMIDE 100 MG/10ML IV SOLN
INTRAVENOUS | Status: DC | PRN
  Administered 2017-04-14: 40 mg via INTRAVENOUS

## 2017-04-14 MED ORDER — GLYCOPYRROLATE 0.2 MG/ML IJ SOLN
INTRAMUSCULAR | Status: DC | PRN
  Administered 2017-04-14: .4 mg via INTRAVENOUS
  Administered 2017-04-14: 0.2 mg via INTRAVENOUS

## 2017-04-14 MED ORDER — NEOSTIGMINE METHYLSULFATE 10 MG/10ML IV SOLN
INTRAVENOUS | Status: AC
  Filled 2017-04-14: qty 1

## 2017-04-14 MED ORDER — FENTANYL CITRATE (PF) 100 MCG/2ML IJ SOLN
INTRAMUSCULAR | Status: DC | PRN
  Administered 2017-04-14: 50 ug via INTRAVENOUS
  Administered 2017-04-14: 100 ug via INTRAVENOUS
  Administered 2017-04-14 (×2): 50 ug via INTRAVENOUS

## 2017-04-14 MED ORDER — SODIUM CHLORIDE 0.9 % IJ SOLN
INTRAMUSCULAR | Status: DC | PRN
  Administered 2017-04-14: 10 mL

## 2017-04-14 MED ORDER — GLYCOPYRROLATE 0.2 MG/ML IJ SOLN
INTRAMUSCULAR | Status: AC
  Filled 2017-04-14: qty 3

## 2017-04-14 MED ORDER — HYDROMORPHONE HCL 1 MG/ML IJ SOLN
INTRAMUSCULAR | Status: AC
  Filled 2017-04-14: qty 1

## 2017-04-14 MED ORDER — PROPOFOL 10 MG/ML IV BOLUS
INTRAVENOUS | Status: DC | PRN
  Administered 2017-04-14: 200 mg via INTRAVENOUS

## 2017-04-14 MED ORDER — BUPIVACAINE HCL (PF) 0.25 % IJ SOLN
INTRAMUSCULAR | Status: AC
  Filled 2017-04-14: qty 30

## 2017-04-14 MED ORDER — PROPOFOL 10 MG/ML IV BOLUS
INTRAVENOUS | Status: AC
  Filled 2017-04-14: qty 20

## 2017-04-14 MED ORDER — SCOPOLAMINE 1 MG/3DAYS TD PT72
MEDICATED_PATCH | TRANSDERMAL | Status: AC
  Administered 2017-04-14: 1.5 mg via TRANSDERMAL
  Filled 2017-04-14: qty 1

## 2017-04-14 MED ORDER — OXYCODONE HCL 5 MG PO TABS: 5.0000 mg | ORAL_TABLET | Freq: Once | ORAL | Status: DC | PRN

## 2017-04-14 MED ORDER — LIDOCAINE HCL (CARDIAC) 20 MG/ML IV SOLN
INTRAVENOUS | Status: AC
  Filled 2017-04-14: qty 5

## 2017-04-14 MED ORDER — KETOROLAC TROMETHAMINE 30 MG/ML IJ SOLN: 30.0000 mg | Freq: Once | INTRAMUSCULAR | Status: DC | PRN

## 2017-04-14 MED ORDER — ONDANSETRON HCL 4 MG/2ML IJ SOLN
INTRAMUSCULAR | Status: AC
  Filled 2017-04-14: qty 2

## 2017-04-14 MED ORDER — ONDANSETRON HCL 4 MG/2ML IJ SOLN
INTRAMUSCULAR | Status: DC | PRN
  Administered 2017-04-14: 4 mg via INTRAVENOUS

## 2017-04-14 MED ORDER — DEXAMETHASONE SODIUM PHOSPHATE 4 MG/ML IJ SOLN
INTRAMUSCULAR | Status: DC | PRN
  Administered 2017-04-14: 4 mg via INTRAVENOUS

## 2017-04-14 MED ORDER — PROPOFOL 10 MG/ML IV BOLUS: INTRAVENOUS | Status: DC | PRN

## 2017-04-14 MED ORDER — SCOPOLAMINE 1 MG/3DAYS TD PT72
1.0000 | MEDICATED_PATCH | Freq: Once | TRANSDERMAL | Status: DC
  Administered 2017-04-14: 1.5 mg via TRANSDERMAL

## 2017-04-14 MED ORDER — LACTATED RINGERS IR SOLN
Status: DC | PRN
  Administered 2017-04-14: 3000 mL

## 2017-04-14 MED ORDER — SODIUM CHLORIDE 0.9 % IJ SOLN
INTRAMUSCULAR | Status: AC
  Filled 2017-04-14: qty 20

## 2017-04-14 MED ORDER — LACTATED RINGERS IV SOLN
INTRAVENOUS | Status: DC
  Administered 2017-04-14 (×2): via INTRAVENOUS

## 2017-04-14 MED ORDER — NEOSTIGMINE METHYLSULFATE 10 MG/10ML IV SOLN
INTRAVENOUS | Status: DC | PRN
  Administered 2017-04-14: 2.5 mg via INTRAVENOUS

## 2017-04-14 SURGICAL SUPPLY — 41 items
APPLICATOR ARISTA FLEXITIP XL (MISCELLANEOUS) ×3 IMPLANT
CLOTH BEACON ORANGE TIMEOUT ST (SAFETY) ×3 IMPLANT
COVER LIGHT HANDLE  1/PK (MISCELLANEOUS) ×1
COVER LIGHT HANDLE 1/PK (MISCELLANEOUS) ×2 IMPLANT
DERMABOND ADVANCED (GAUZE/BANDAGES/DRESSINGS) ×1
DERMABOND ADVANCED .7 DNX12 (GAUZE/BANDAGES/DRESSINGS) ×2 IMPLANT
DEVICE TROCAR PUNCTURE CLOSURE (ENDOMECHANICALS) IMPLANT
DRSG OPSITE POSTOP 3X4 (GAUZE/BANDAGES/DRESSINGS) ×3 IMPLANT
DURAPREP 26ML APPLICATOR (WOUND CARE) ×3 IMPLANT
FORCEPS CUTTING 33CM 5MM (CUTTING FORCEPS) IMPLANT
GLOVE BIOGEL PI IND STRL 6.5 (GLOVE) ×4 IMPLANT
GLOVE BIOGEL PI IND STRL 7.0 (GLOVE) ×4 IMPLANT
GLOVE BIOGEL PI INDICATOR 6.5 (GLOVE) ×2
GLOVE BIOGEL PI INDICATOR 7.0 (GLOVE) ×2
GLOVE ECLIPSE 6.5 STRL STRAW (GLOVE) ×3 IMPLANT
GOWN STRL REUS W/TWL LRG LVL3 (GOWN DISPOSABLE) ×6 IMPLANT
HEMOSTAT ARISTA ABSORB 3G PWDR (MISCELLANEOUS) ×3 IMPLANT
NEEDLE INSUFFLATION 120MM (ENDOMECHANICALS) ×3 IMPLANT
PACK LAPAROSCOPY BASIN (CUSTOM PROCEDURE TRAY) ×3 IMPLANT
PACK TRENDGUARD 450 HYBRID PRO (MISCELLANEOUS) ×2 IMPLANT
PACK TRENDGUARD 600 HYBRD PROC (MISCELLANEOUS) IMPLANT
POUCH ENDO CATCH II 15MM (MISCELLANEOUS) ×3 IMPLANT
POUCH SPECIMEN RETRIEVAL 10MM (ENDOMECHANICALS) ×3 IMPLANT
PROTECTOR NERVE ULNAR (MISCELLANEOUS) ×6 IMPLANT
SET IRRIG TUBING LAPAROSCOPIC (IRRIGATION / IRRIGATOR) IMPLANT
SHEARS HARMONIC ACE PLUS 36CM (ENDOMECHANICALS) ×3 IMPLANT
SLEEVE XCEL OPT CAN 5 100 (ENDOMECHANICALS) ×3 IMPLANT
SOLUTION ELECTROLUBE (MISCELLANEOUS) ×3 IMPLANT
SUT MON AB 4-0 PS1 27 (SUTURE) IMPLANT
SUT VIC AB 0 CT1 27 (SUTURE)
SUT VIC AB 0 CT1 27XBRD ANBCTR (SUTURE) IMPLANT
SUT VICRYL 0 UR6 27IN ABS (SUTURE) ×3 IMPLANT
SYSTEM CARTER THOMASON II (TROCAR) ×3 IMPLANT
TOWEL OR 17X24 6PK STRL BLUE (TOWEL DISPOSABLE) ×6 IMPLANT
TRAY FOLEY CATH SILVER 14FR (SET/KITS/TRAYS/PACK) ×3 IMPLANT
TRENDGUARD 450 HYBRID PRO PACK (MISCELLANEOUS) ×3
TRENDGUARD 600 HYBRID PROC PK (MISCELLANEOUS)
TROCAR BLADELESS 15MM (ENDOMECHANICALS) ×3 IMPLANT
TROCAR XCEL NON-BLD 11X100MML (ENDOMECHANICALS) ×3 IMPLANT
TROCAR XCEL NON-BLD 5MMX100MML (ENDOMECHANICALS) ×3 IMPLANT
WARMER LAPAROSCOPE (MISCELLANEOUS) IMPLANT

## 2017-04-14 NOTE — Anesthesia Procedure Notes (Addendum)
Procedure Name: Intubation Date/Time: 04/14/2017 11:59 AM Performed by: Nolon Nations Pre-anesthesia Checklist: Patient identified, Patient being monitored, Timeout performed, Emergency Drugs available and Suction available Patient Re-evaluated:Patient Re-evaluated prior to inductionOxygen Delivery Method: Circle System Utilized Preoxygenation: Pre-oxygenation with 100% oxygen Intubation Type: IV induction Ventilation: Mask ventilation without difficulty Laryngoscope Size: 3 and Miller Grade View: Grade II Tube type: Oral Tube size: 6.0 mm Number of attempts: 1 Placement Confirmation: ETT inserted through vocal cords under direct vision,  positive ETCO2 and breath sounds checked- equal and bilateral Secured at: 21 cm Tube secured with: Tape Dental Injury: Teeth and Oropharynx as per pre-operative assessment

## 2017-04-14 NOTE — Op Note (Signed)
PREOPERATIVE DIAGNOSIS:  Right ovarian cyst, pelvic pain POSTOPERATIVE DIAGNOSIS: Right dermoid cyst PROCEDURE PERFORMED: Diagnostic laparoscopy, right oophorectomy SURGEON: Dr. [name] ASSISTANT:Dr. Christophe Louis ANESTHESIA: General endotracheal.  ESTIMATED BLOOD LOSS: 30cccc.  URINE OUTPUT: 200cc of clear yellow urine at the end of the procedure.  IV FLUIDS: 1800cc of crystalloid.  SPECIMEN(S): 1) Right ovary with dermoid cyst COMPLICATIONS: None.  CONDITION: Stable.  FINDINGS: No ascites or peritoneal studding was appreciated.  Liver, gallbladder and bowel appeared grossly normal.  Appendix visualized, unremarkable.  Uterus normal size and shape. Right ovary ~ 12cm bilobed in appearance, normal left ovary.  Normal fallopian tubes bilaterally. Informed consent was obtained from the patient prior to taking her to the operating room where anesthesia was found to be adequate. She was placed in dorsal lithotomy position and examined under anesthesia. She was prepped and draped in normal sterile fashion. The bladder was catheterized with a foley under sterile technique.  A bi-valve speculum was then placed and the anterior lip of the cervix was grasped with the single tooth tenaculum. The uterus was sounded to 7cm cm and [place]ka uterine manipulator was then advanced into the uterus to provide uterine mobility. The speculum and tenaculum were then removed.  Attention was then turned to the patient's abdomen.  10cc of marcaine were injected and a 10 mm infraumbilical skin incision was made with the scalpel. The veress needle was carefully introduced into the peritoneal cavity while tenting the abdominal wall. Intraperitoneal placement was confirmed by use of a saline-drop test.  The gas was connected and confirmed intrabdominal placement by a low initial pressure of 2 mmHg. The abdomen was then insuflated with CO2 gas. The trocar and sleeve were then advanced without difficulty into the abdomen under  direct visualization. Intraabdominal placement was confirmed by the laparoscope and surveillance of the abdomen was performed with the findings as listed above.  Two additional 73mm skin incision were made in the left and right lower quadrants with placement of the trocar under direct visualization and injected with marcaine.  The right ovary was then placed on tension and using the Harmonic, the right infundipulopelvic ligament was clamped and ligated. Serial ligations were used for full dissection of the right ovary using the harmonic was performed.  Due to the large size of the cyst, the umbilical incision was extended to fit a larger bag.  The endocatch bag was then placed through the 15 mm port.  The specimen was drained- both straw colored and thick yellow fluid drained along with hair.  Findings confirm dermoid cyst.  Re-examination of the uterus and abdominal cavity confirmed hemostasis. Arista was placed on the right adnexal region.  Bilateral trocars were removed under direct visualization and air was allowed to escape.  The fascia was closed using 0-vicryl without difficulty under direct visualization.  The umbilical port site was closed with monocryl and dermabond was placed over all incisions.  The manipulator and foley catheter were removed from the cervix.  Silver nitrate was used to obtain adequate hemostasis.  The patient tolerated the procedure well with all sponge, lap, and needle counts correct. The patient was taken to recovery in stable condition.   Dr. Landry Mellow was present to assist as residents are not available to our service.  [name], DO [phone] (pager) [phone] (office)

## 2017-04-14 NOTE — Transfer of Care (Signed)
Immediate Anesthesia Transfer of Care Note  Patient: Rhonda Bradshaw  Procedure(s) Performed: Procedure(s): LAPAROSCOPIC OOPHORECTOMY (Right)  Patient Location: PACU  Anesthesia Type:General  Level of Consciousness: awake, alert  and oriented  Airway & Oxygen Therapy: Patient Spontanous Breathing and Patient connected to nasal cannula oxygen  Post-op Assessment: Report given to RN, Post -op Vital signs reviewed and stable and Patient moving all extremities X 4  Post vital signs: Reviewed and stable  Last Vitals:  Vitals:   04/14/17 0929  BP: 107/77  Pulse: 68  Resp: 16  Temp: 36.8 C    Last Pain:  Vitals:   04/14/17 0929  TempSrc: Oral  PainSc: 3       Patients Stated Pain Goal: 5 (55/01/58 6825)  Complications: No apparent anesthesia complications

## 2017-04-14 NOTE — Interval H&P Note (Signed)
History and Physical Interval Note:  04/14/2017 10:19 AM  Rhonda Bradshaw  has presented today for surgery, with the diagnosis of RIGHT OVARIAN CYST  The various methods of treatment have been discussed with the patient and family. After consideration of risks, benefits and other options for treatment, the patient has consented to  Procedure(s): LAPAROSCOPY DIAGNOSTIC REMOVAL OF RIGHT OVARIAN CYST,AND POSSIBLE OOPHORECTOMY (Right) as a surgical intervention .  The patient's history has been reviewed, patient examined, no change in status, stable for surgery.  I have reviewed the patient's chart and labs.  Questions were answered to the patient's satisfaction.     [name], M

## 2017-04-14 NOTE — Discharge Instructions (Addendum)
HOME INSTRUCTIONS  Please note any unusual or excessive bleeding, pain, swelling. Mild dizziness or drowsiness are normal for about 24 hours after surgery.   Shower when comfortable  Restrictions: No driving for 24 hours or while taking pain medications.  Activity:  No heavy lifting (> 15 lbs), nothing in vagina (no tampons, douching, or intercourse) x 2 weeks; no tub baths for 2 weeks Vaginal spotting is expected but if your bleeding is heavy, period like,  please call the office   Incision: the dermabond will fall off when they are ready; you may clean your incision with mild soap and water but do not rub or scrub the incision site.  You may experience slight bloody drainage from your incision periodically.  This is normal.  If you experience a large amount of drainage or the incision opens, please call your physician who will likely direct you to the emergency department.  Diet:  You may return to your regular diet.  Do not eat large meals.  Eat small frequent meals throughout the day.  Continue to drink a good amount of water at least 6-8 glasses of water per day, hydration is very important for the healing process.  Pain Management: Take Motrin 3x daily with food during the first several days.  Alternate this medication with hydromorophone as needed.  Continue with the Motrin as needed over the next 1-2 weeks.  Always take prescription pain medication with food, it may cause constipation, increase fluids and fiber and you may want to take an over-the-counter stool softener like Colace as needed up to 2x a day.    Alcohol -- Avoid for 24 hours and while taking pain medications.  Nausea: Take sips of ginger ale or soda  Fever -- Call physician if temperature over 101 degrees  Follow up:  If you do not already have a follow up appointment scheduled, please call the office at [phone].  If you experience fever (a temperature greater than 100.4), pain unrelieved by pain medication,  shortness of breath, swelling of a single leg, or any other symptoms which are concerning to you please the office immediately.    Post Anesthesia Home Care Instructions  Activity: Get plenty of rest for the remainder of the day. A responsible individual must stay with you for 24 hours following the procedure.  For the next 24 hours, DO NOT: -Drive a car -Paediatric nurse -Drink alcoholic beverages -Take any medication unless instructed by your physician -Make any legal decisions or sign important papers.  Meals: Start with liquid foods such as gelatin or soup. Progress to regular foods as tolerated. Avoid greasy, spicy, heavy foods. If nausea and/or vomiting occur, drink only clear liquids until the nausea and/or vomiting subsides. Call your physician if vomiting continues.  Special Instructions/Symptoms: Your throat may feel dry or sore from the anesthesia or the breathing tube placed in your throat during surgery. If this causes discomfort, gargle with warm salt water. The discomfort should disappear within 24 hours.  If you had a scopolamine patch placed behind your ear for the management of post- operative nausea and/or vomiting:  1. The medication in the patch is effective for 72 hours, after which it should be removed.  Wrap patch in a tissue and discard in the trash. Wash hands thoroughly with soap and water. 2. You may remove the patch earlier than 72 hours if you experience unpleasant side effects which may include dry mouth, dizziness or visual disturbances. 3. Avoid touching the patch. Wash your  hands with soap and water after contact with the patch.     NO IBUPROFEN PRODUCTS (MOTRIN, ADVIL) OR ALEVE UNTIL 6:OOPM TODAY.

## 2017-04-14 NOTE — Anesthesia Preprocedure Evaluation (Signed)
Anesthesia Evaluation  Patient identified by MRN, date of birth, ID band Patient awake    Reviewed: Allergy & Precautions, NPO status , Patient's Chart, lab work & pertinent test results  Airway Mallampati: I  TM Distance: >3 FB Neck ROM: Full    Dental no notable dental hx.    Pulmonary neg pulmonary ROS,    Pulmonary exam normal breath sounds clear to auscultation       Cardiovascular negative cardio ROS Normal cardiovascular exam Rhythm:Regular Rate:Normal     Neuro/Psych negative neurological ROS  negative psych ROS   GI/Hepatic negative GI ROS, Neg liver ROS,   Endo/Other  negative endocrine ROS  Renal/GU negative Renal ROS     Musculoskeletal negative musculoskeletal ROS (+)   Abdominal   Peds  Hematology negative hematology ROS (+)   Anesthesia Other Findings   Reproductive/Obstetrics negative OB ROS                             Anesthesia Physical Anesthesia Plan  ASA: I  Anesthesia Plan: General   Post-op Pain Management:    Induction: Intravenous  PONV Risk Score and Plan: 4 or greater and Ondansetron, Dexamethasone, Propofol, Midazolam and Scopolamine patch - Pre-op  Airway Management Planned: Oral ETT  Additional Equipment:   Intra-op Plan:   Post-operative Plan: Extubation in OR  Informed Consent: I have reviewed the patients History and Physical, chart, labs and discussed the procedure including the risks, benefits and alternatives for the proposed anesthesia with the patient or authorized representative who has indicated his/her understanding and acceptance.   Dental advisory given  Plan Discussed with: CRNA  Anesthesia Plan Comments:         Anesthesia Quick Evaluation

## 2017-04-15 NOTE — Anesthesia Postprocedure Evaluation (Signed)
Anesthesia Post Note  Patient: Rhonda Bradshaw  Procedure(s) Performed: Procedure(s) (LRB): LAPAROSCOPIC OOPHORECTOMY (Right)     Patient location during evaluation: PACU Anesthesia Type: General Level of consciousness: sedated and patient cooperative Pain management: pain level controlled Vital Signs Assessment: post-procedure vital signs reviewed and stable Respiratory status: spontaneous breathing Cardiovascular status: stable Anesthetic complications: no    Last Vitals:  Vitals:   04/14/17 1324 04/14/17 1415  BP:  123/71  Pulse: 77 77  Resp: 16 18  Temp: 36.6 C     Last Pain:  Vitals:   04/14/17 1324  TempSrc:   PainSc: [place]

## 2017-12-15 IMAGING — US US ART/VEN ABD/PELV/SCROTUM DOPPLER LTD
1 series · 13 of 25 positions shown · non-contrast
Comparison: None.

CLINICAL DATA: Right lower quadrant abdomen pain.

EXAM:
TRANSABDOMINAL AND TRANSVAGINAL ULTRASOUND OF PELVIS
DOPPLER ULTRASOUND OF OVARIES
TECHNIQUE: Both transabdominal and transvaginal ultrasound examinations of the
pelvis were performed. Transabdominal technique was performed for
global imaging of the pelvis including uterus, ovaries, adnexal
regions, and pelvic cul-de-sac.
It was necessary to proceed with endovaginal exam following the
transabdominal exam to visualize the ovaries. Color and duplex
Doppler ultrasound was utilized to evaluate blood flow to the
ovaries.

[Series 1: us art/ven abd/pelv/scrotum doppler ltd · 0.20mm/px · 71 acquisitions, 13 frames shown]
[im 1/71]
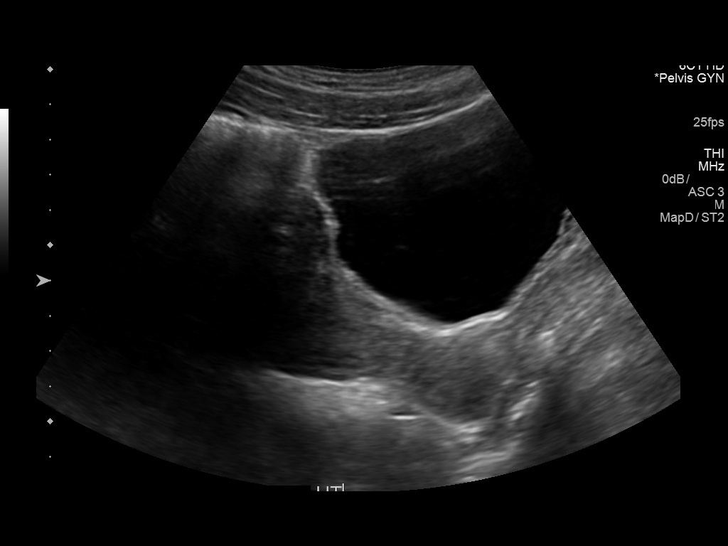
[im 6/71]
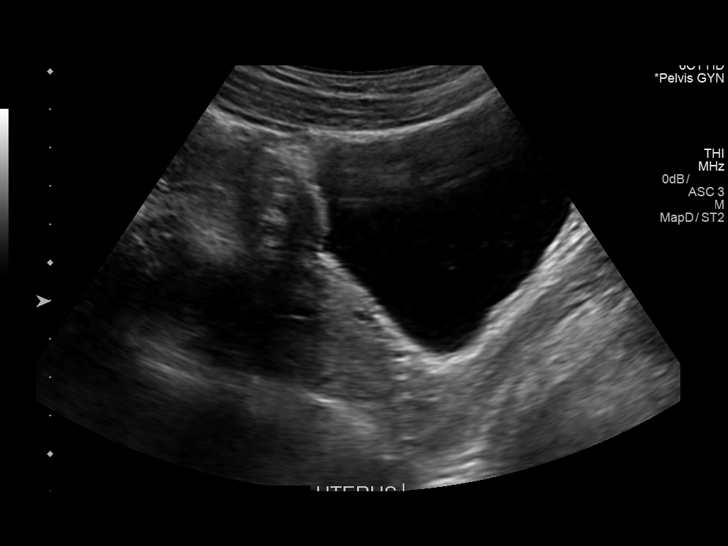
[im 12/71]
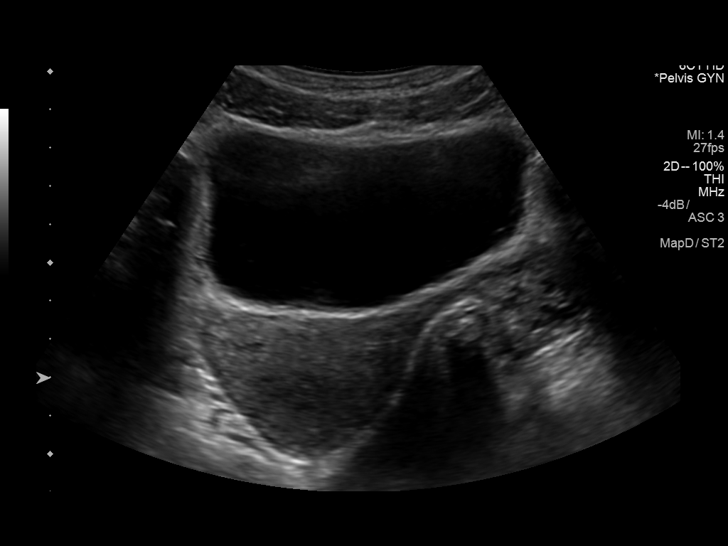
[im 18/71]
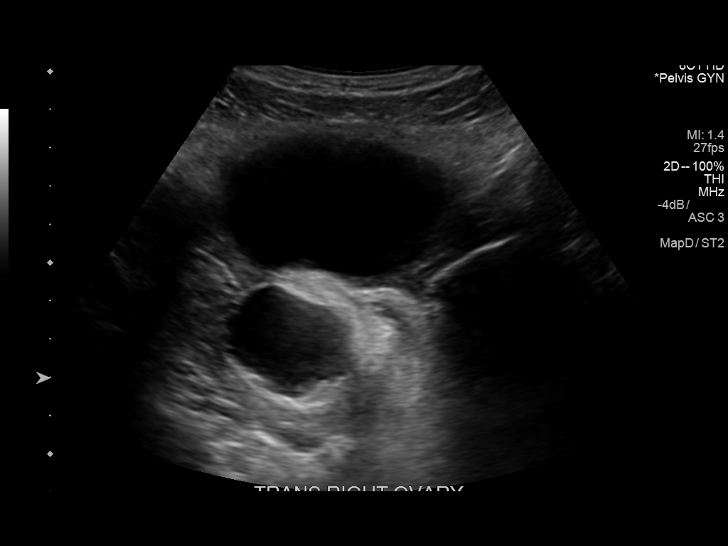
[im 24/71]
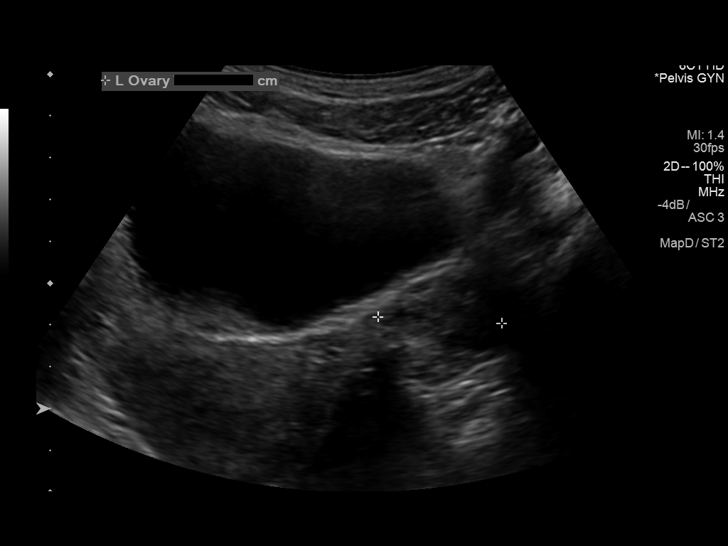
[im 30/71]
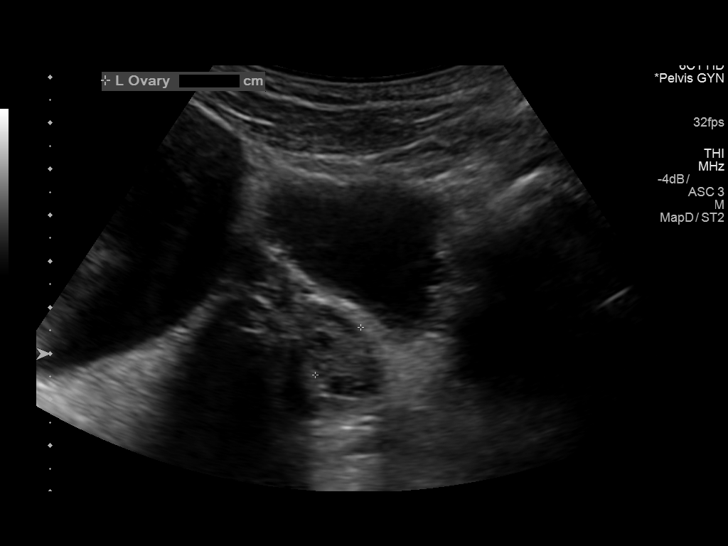
[im 36/71]
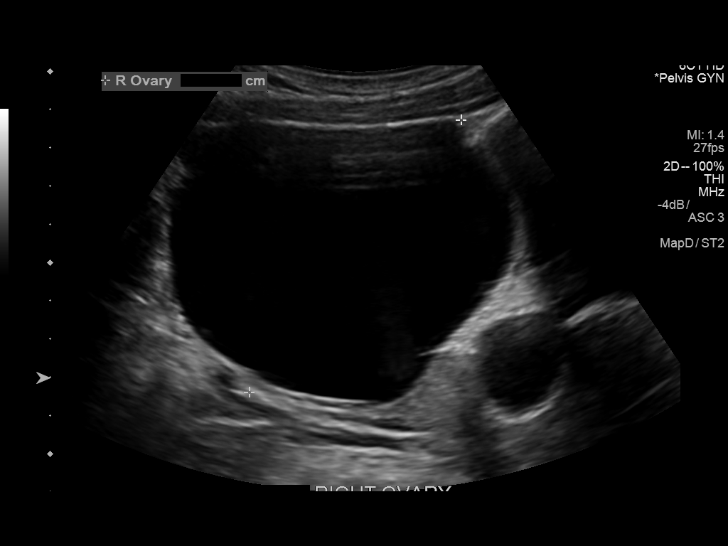
[im 41/71]
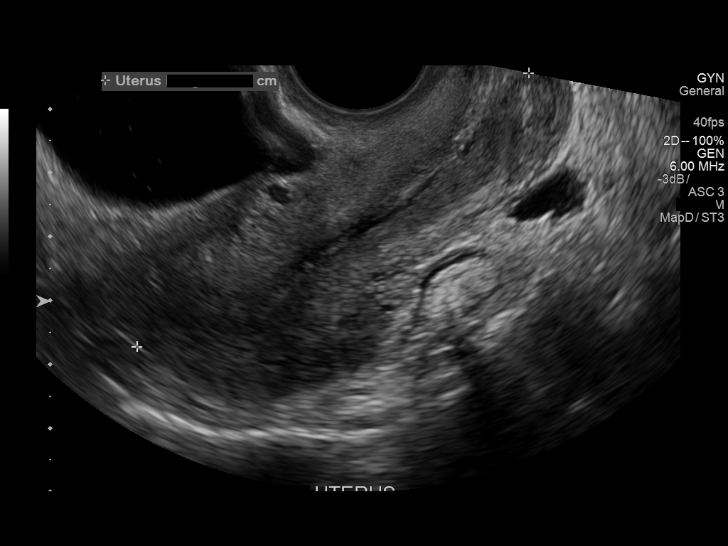
[im 47/71]
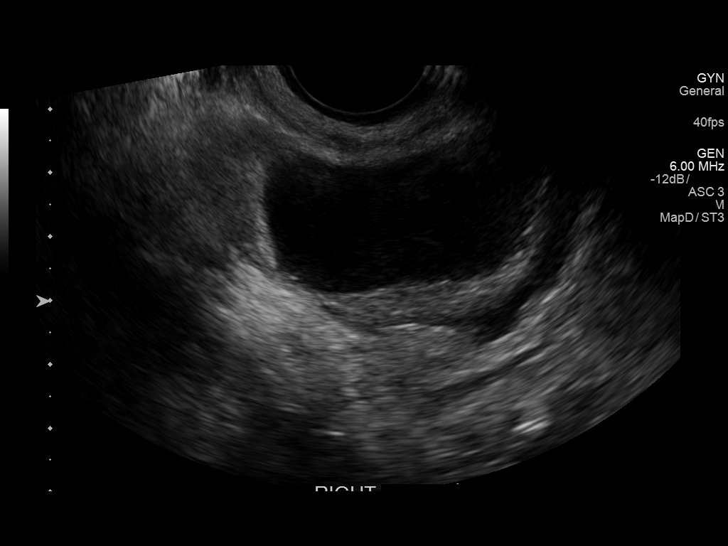
[im 53/71]
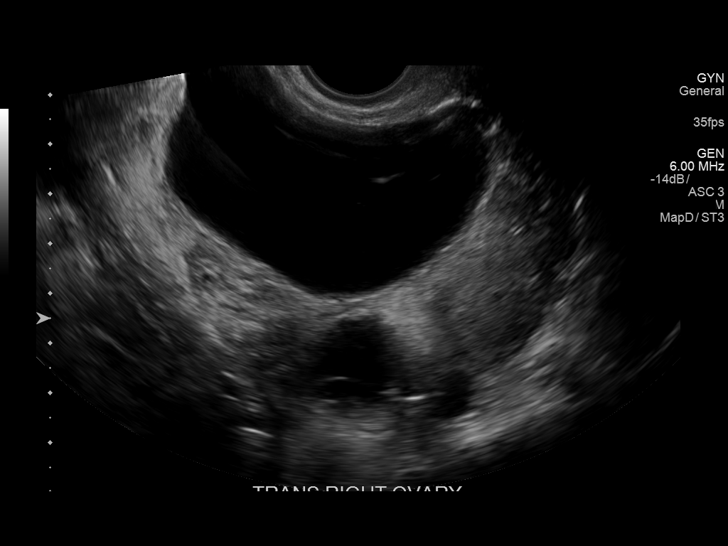
[im 59/71]
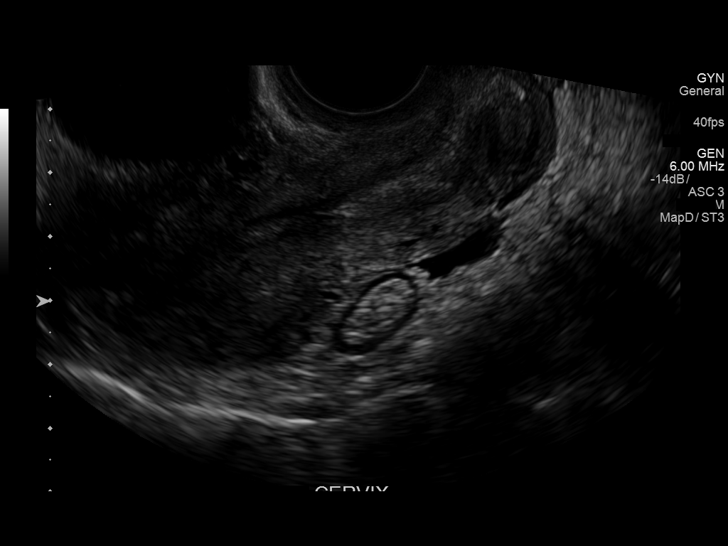
[im 65/71]
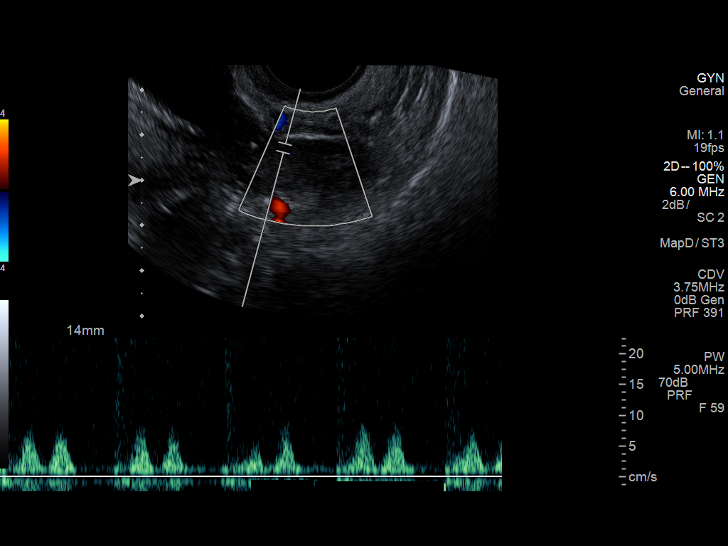
[im 71/71]
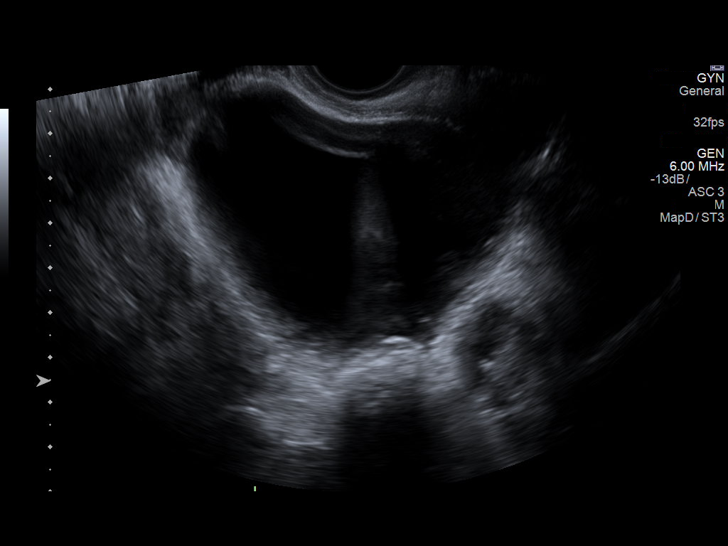

[13 of 25 positions shown; findings below may reference images not displayed]

FINDINGS: Uterus

Measurements: 7.8 x 3.5 x 5 cm. No fibroids or other mass
visualized.

Endometrium

Thickness: 2.5 mm.  No focal abnormality visualized.

Right ovary

In the right adnexa, there is a large right ovarian cyst with
internal septations measuring 11.6 x 4.9 x 9 cm.

Left ovary

Measurements: 2.7 x 1.4 x 3 cm. Normal appearance/no adnexal mass.

Pulsed Doppler evaluation of both ovaries demonstrates normal
low-resistance arterial and venous waveforms.

Other findings

Small amount of free fluid is identified in the pelvis.
IMPRESSION: Large complicated cyst of the right ovary measuring 11.6 x 4.9 x 9
cm. There is no evidence a right ovary torsion as arterial and
venous flow are identified in the periphery of the complicated
cyst/right ovarian tissue.

## 2019-03-17 IMAGING — US US PELVIS COMPLETE
1 series · 15 of 23 positions shown · non-contrast
Comparison: CT of the abdomen and pelvis, and pelvic ultrasound
performed 04/07/2017

CLINICAL DATA: Acute onset of pelvic pain. Known large right
ovarian cyst. Assess for ovarian torsion. Initial encounter.

EXAM:
TRANSABDOMINAL ULTRASOUND OF PELVIS
DOPPLER ULTRASOUND OF OVARIES
TECHNIQUE: Transabdominal ultrasound examination of the pelvis was performed
including evaluation of the uterus, ovaries, adnexal regions, and
pelvic cul-de-sac.
Color and duplex Doppler ultrasound was utilized to evaluate blood
flow to the ovaries.

[Series 1: us pelvis complete · 15 of 23 slices shown]
[im 1/23]
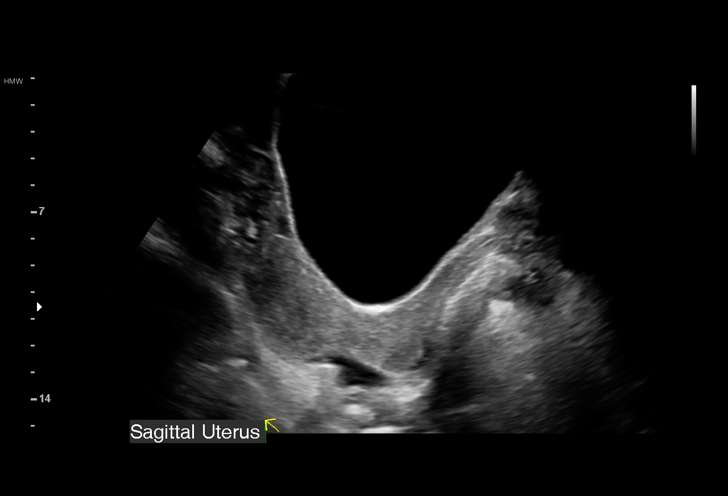
[im 3/23]
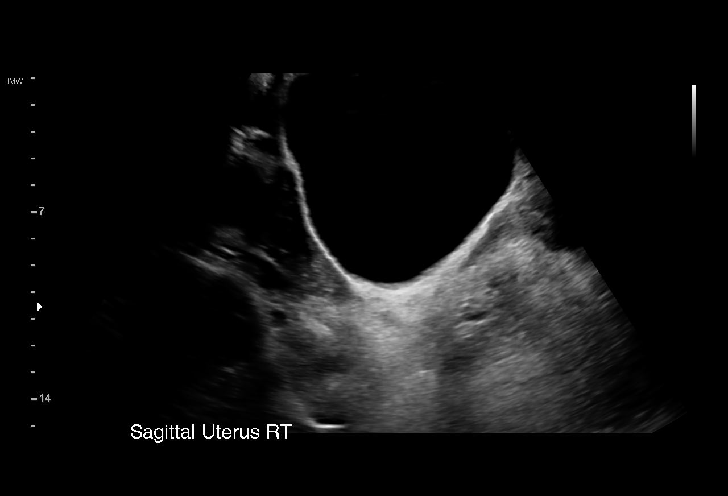
[im 4/23]
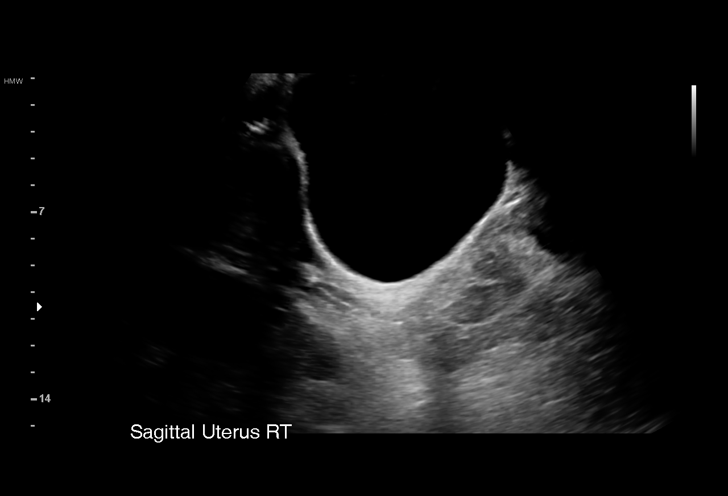
[im 6/23]
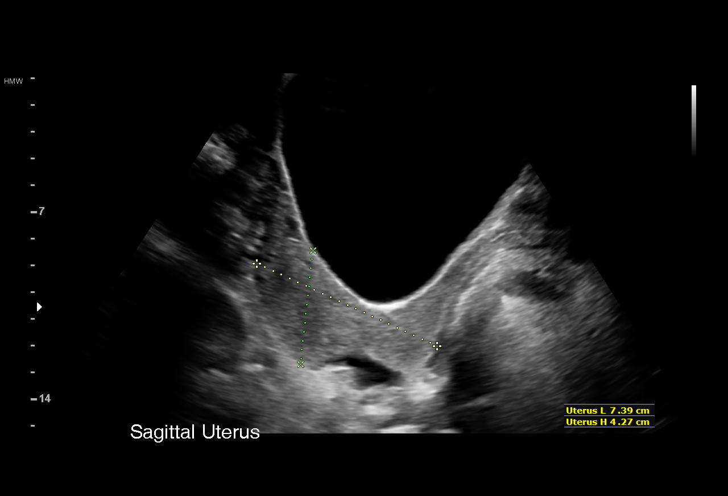
[im 7/23]
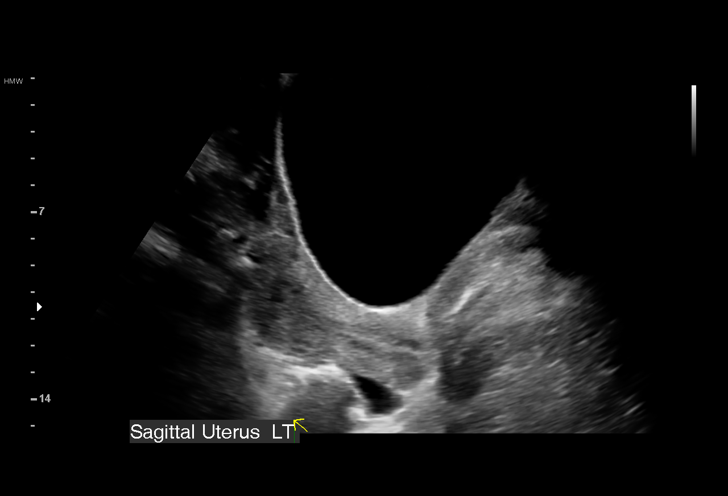
[im 9/23]
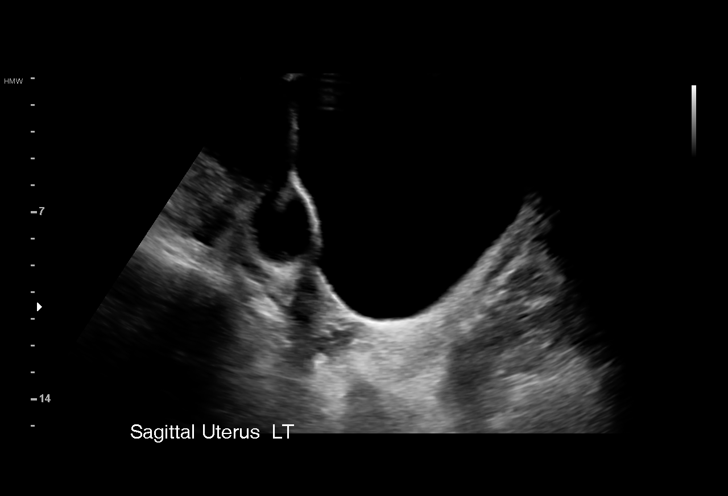
[im 10/23]
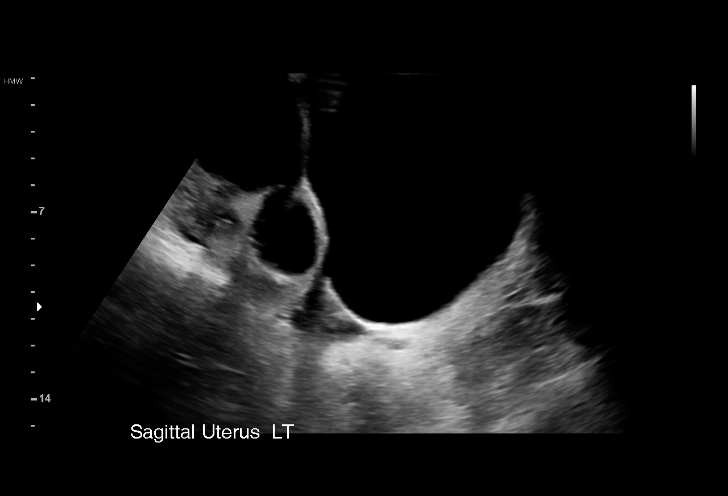
[im 12/23]
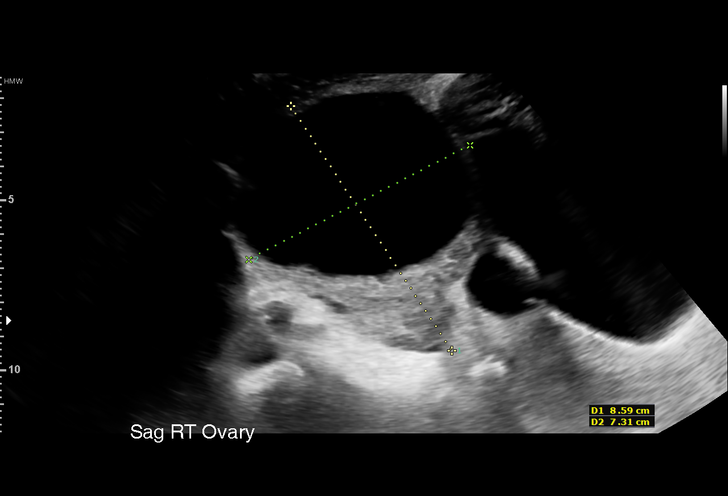
[im 14/23]
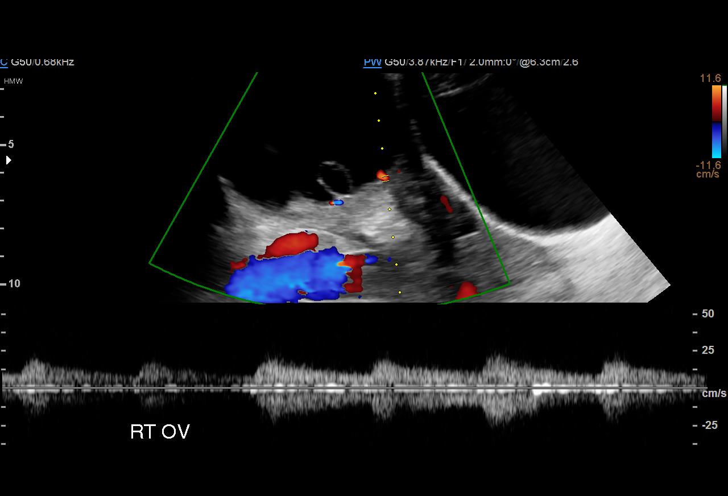
[im 15/23]
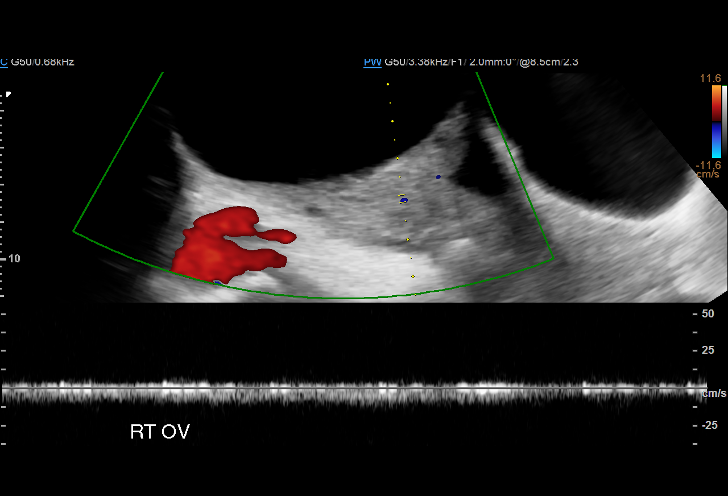
[im 17/23]
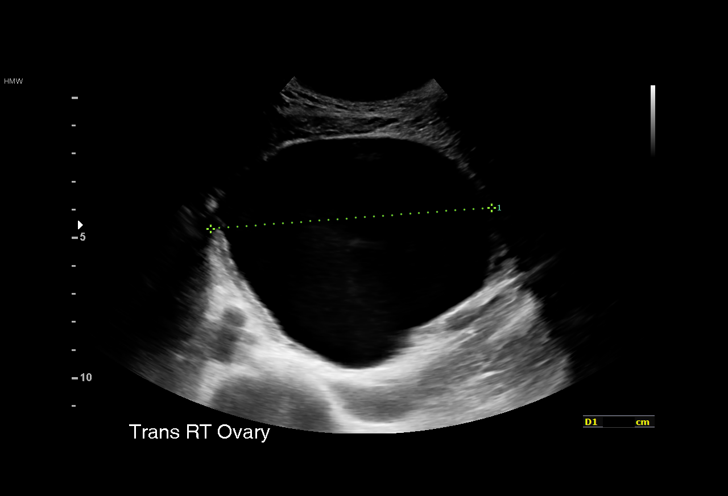
[im 18/23]
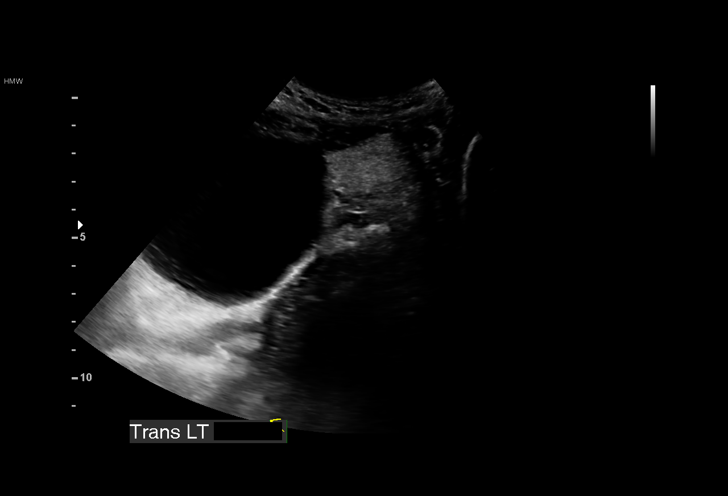
[im 20/23]
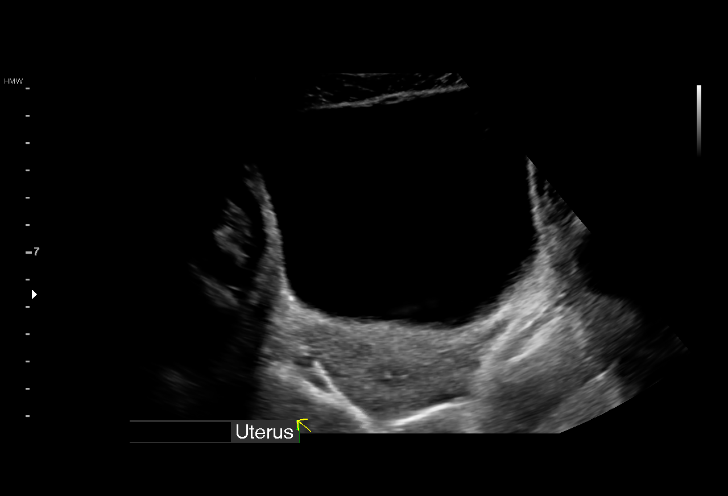
[im 21/23]
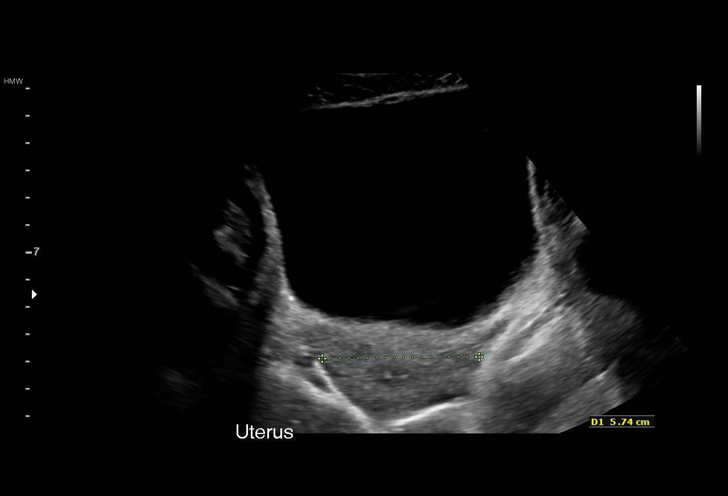
[im 23/23]
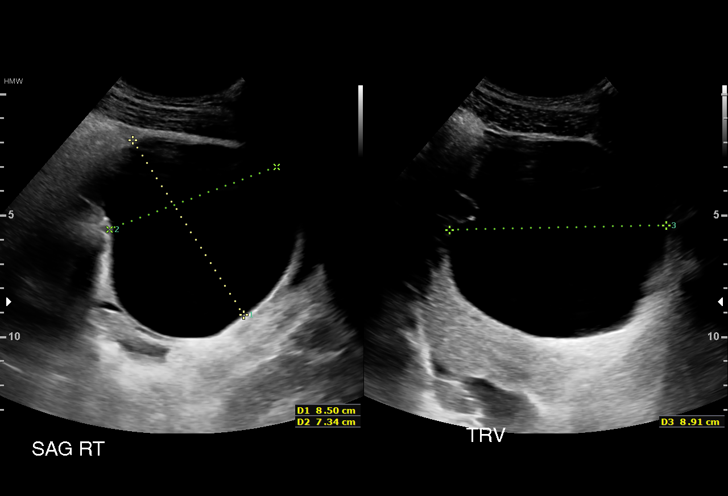

[15 of 23 positions shown; findings below may reference images not displayed]

FINDINGS: Uterus

Measurements: 7.4 x 4.3 x 5.7 cm. No fibroids or other mass
visualized.

Endometrium

Thickness: 0.3 cm. No focal abnormality visualized.

Right ovary

Measurements: 10.0 x 8.6 x 7.3 cm. A large simple appearing 8.9 x
8.5 x 7.3 cm cyst is noted at the right ovary.

Left ovary

Not visualized.

Pulsed Doppler evaluation demonstrates normal low-resistance
arterial and venous waveforms at the right ovary.
IMPRESSION: No evidence for ovarian torsion on the right. Large 9 cm right
ovarian cyst again noted. The left ovary is not visualized.
